# Patient Record
Sex: Male | Born: 1940 | Race: White | Hispanic: No | Smoking: Never smoker
Health system: Southern US, Community
[De-identification: ages and names within clinical notes are randomized; demographics above are authoritative.]

## PROBLEM LIST (undated history)

## (undated) DIAGNOSIS — M179 Osteoarthritis of knee, unspecified: Secondary | ICD-10-CM

## (undated) DIAGNOSIS — M109 Gout, unspecified: Secondary | ICD-10-CM

## (undated) DIAGNOSIS — R791 Abnormal coagulation profile: Secondary | ICD-10-CM

## (undated) DIAGNOSIS — R202 Paresthesia of skin: Secondary | ICD-10-CM

## (undated) DIAGNOSIS — R200 Anesthesia of skin: Secondary | ICD-10-CM

## (undated) DIAGNOSIS — R5383 Other fatigue: Secondary | ICD-10-CM

## (undated) DIAGNOSIS — R2689 Other abnormalities of gait and mobility: Secondary | ICD-10-CM

## (undated) DIAGNOSIS — B009 Herpesviral infection, unspecified: Secondary | ICD-10-CM

## (undated) DIAGNOSIS — Z973 Presence of spectacles and contact lenses: Secondary | ICD-10-CM

## (undated) DIAGNOSIS — E7800 Pure hypercholesterolemia, unspecified: Secondary | ICD-10-CM

## (undated) DIAGNOSIS — F419 Anxiety disorder, unspecified: Secondary | ICD-10-CM

## (undated) DIAGNOSIS — M549 Dorsalgia, unspecified: Secondary | ICD-10-CM

## (undated) DIAGNOSIS — Z8781 Personal history of (healed) traumatic fracture: Secondary | ICD-10-CM

## (undated) DIAGNOSIS — Z9189 Other specified personal risk factors, not elsewhere classified: Secondary | ICD-10-CM

## (undated) DIAGNOSIS — G4700 Insomnia, unspecified: Secondary | ICD-10-CM

## (undated) DIAGNOSIS — E785 Hyperlipidemia, unspecified: Secondary | ICD-10-CM

## (undated) DIAGNOSIS — Z9289 Personal history of other medical treatment: Secondary | ICD-10-CM

## (undated) DIAGNOSIS — I10 Essential (primary) hypertension: Secondary | ICD-10-CM

## (undated) DIAGNOSIS — M1710 Unilateral primary osteoarthritis, unspecified knee: Secondary | ICD-10-CM

## (undated) HISTORY — DX: Abnormal coagulation profile: R79.1

## (undated) HISTORY — PX: TONSILLECTOMY AND ADENOIDECTOMY: SHX28

## (undated) HISTORY — DX: Gout, unspecified: M10.9

## (undated) HISTORY — DX: Osteoarthritis of knee, unspecified: M17.9

## (undated) HISTORY — PX: OTHER SURGICAL HISTORY: SHX169

## (undated) HISTORY — PX: TONSILLECTOMY: SUR1361

## (undated) HISTORY — PX: VASECTOMY: SHX75

## (undated) HISTORY — DX: Anxiety disorder, unspecified: F41.9

## (undated) HISTORY — PX: ROTATOR CUFF REPAIR: SHX139

## (undated) HISTORY — DX: Unilateral primary osteoarthritis, unspecified knee: M17.10

## (undated) HISTORY — DX: Hyperlipidemia, unspecified: E78.5

## (undated) HISTORY — DX: Essential (primary) hypertension: I10

## (undated) HISTORY — DX: Herpesviral infection, unspecified: B00.9

---

## 2000-07-05 ENCOUNTER — Ambulatory Visit (HOSPITAL_COMMUNITY): Admission: RE | Admit: 2000-07-05 | Discharge: 2000-07-05 | Payer: Self-pay | Admitting: Ophthalmology

## 2004-04-29 ENCOUNTER — Ambulatory Visit: Payer: Self-pay | Admitting: Internal Medicine

## 2004-05-04 ENCOUNTER — Ambulatory Visit: Payer: Self-pay | Admitting: Internal Medicine

## 2004-07-14 ENCOUNTER — Ambulatory Visit: Payer: Self-pay | Admitting: Internal Medicine

## 2004-07-28 ENCOUNTER — Ambulatory Visit: Payer: Self-pay | Admitting: Internal Medicine

## 2004-08-10 ENCOUNTER — Ambulatory Visit: Payer: Self-pay | Admitting: Internal Medicine

## 2005-07-13 ENCOUNTER — Ambulatory Visit: Payer: Self-pay | Admitting: Internal Medicine

## 2005-07-21 ENCOUNTER — Ambulatory Visit: Payer: Self-pay | Admitting: Internal Medicine

## 2005-08-27 ENCOUNTER — Ambulatory Visit: Payer: Self-pay | Admitting: Internal Medicine

## 2005-08-27 ENCOUNTER — Ambulatory Visit: Payer: Self-pay | Admitting: Gastroenterology

## 2005-09-15 ENCOUNTER — Ambulatory Visit: Payer: Self-pay | Admitting: Gastroenterology

## 2005-09-15 LAB — HM COLONOSCOPY: HM Colonoscopy: NORMAL

## 2006-08-23 ENCOUNTER — Ambulatory Visit: Payer: Self-pay | Admitting: Internal Medicine

## 2006-08-23 LAB — CONVERTED CEMR LAB
ALT: 27 units/L (ref 0–40)
AST: 36 units/L (ref 0–37)
Basophils Relative: 0.7 % (ref 0.0–1.0)
Bilirubin, Direct: 0.2 mg/dL (ref 0.0–0.3)
CO2: 30 meq/L (ref 19–32)
Calcium: 9.7 mg/dL (ref 8.4–10.5)
Chloride: 102 meq/L (ref 96–112)
Creatinine, Ser: 0.9 mg/dL (ref 0.4–1.5)
Eosinophils Relative: 1.3 % (ref 0.0–5.0)
GFR calc non Af Amer: 91 mL/min
Glucose, Bld: 112 mg/dL — ABNORMAL HIGH (ref 70–99)
HCT: 47.6 % (ref 39.0–52.0)
Ketones, ur: NEGATIVE mg/dL
MCV: 90.2 fL (ref 78.0–100.0)
Neutrophils Relative %: 55.6 % (ref 43.0–77.0)
Nitrite: NEGATIVE
Platelets: 196 10*3/uL (ref 150–400)
RBC: 5.28 M/uL (ref 4.22–5.81)
RDW: 12.5 % (ref 11.5–14.6)
Sodium: 141 meq/L (ref 135–145)
Specific Gravity, Urine: 1.015 (ref 1.000–1.03)
TSH: 1.16 microintl units/mL (ref 0.35–5.50)
Total Bilirubin: 1 mg/dL (ref 0.3–1.2)
Total CHOL/HDL Ratio: 4.5
Total Protein, Urine: NEGATIVE mg/dL
Uric Acid, Serum: 5.4 mg/dL (ref 2.4–7.0)
Urine Glucose: NEGATIVE mg/dL
Urobilinogen, UA: 0.2 (ref 0.0–1.0)
WBC: 8.3 10*3/uL (ref 4.5–10.5)
pH: 6 (ref 5.0–8.0)

## 2006-08-31 ENCOUNTER — Ambulatory Visit: Payer: Self-pay | Admitting: Internal Medicine

## 2006-12-19 ENCOUNTER — Ambulatory Visit: Payer: Self-pay | Admitting: Internal Medicine

## 2006-12-22 ENCOUNTER — Encounter: Admission: RE | Admit: 2006-12-22 | Discharge: 2006-12-22 | Payer: Self-pay | Admitting: Internal Medicine

## 2007-02-17 ENCOUNTER — Ambulatory Visit (HOSPITAL_BASED_OUTPATIENT_CLINIC_OR_DEPARTMENT_OTHER): Admission: RE | Admit: 2007-02-17 | Discharge: 2007-02-17 | Payer: Self-pay | Admitting: Orthopedic Surgery

## 2007-09-25 ENCOUNTER — Telehealth: Payer: Self-pay | Admitting: Internal Medicine

## 2007-11-23 ENCOUNTER — Ambulatory Visit: Payer: Self-pay | Admitting: Internal Medicine

## 2007-11-23 LAB — CONVERTED CEMR LAB
AST: 32 units/L (ref 0–37)
Albumin: 4 g/dL (ref 3.5–5.2)
Alkaline Phosphatase: 138 units/L — ABNORMAL HIGH (ref 39–117)
Basophils Absolute: 0 10*3/uL (ref 0.0–0.1)
Bilirubin Urine: NEGATIVE
Bilirubin, Direct: 0.1 mg/dL (ref 0.0–0.3)
Chloride: 101 meq/L (ref 96–112)
Eosinophils Absolute: 0.2 10*3/uL (ref 0.0–0.7)
GFR calc Af Amer: 125 mL/min
GFR calc non Af Amer: 103 mL/min
HCT: 43.5 % (ref 39.0–52.0)
HDL: 53.6 mg/dL (ref >39.0)
Hemoglobin, Urine: NEGATIVE
LDL Cholesterol: 127 mg/dL — ABNORMAL HIGH (ref 0–99)
MCV: 91.8 fL (ref 78.0–100.0)
Monocytes Absolute: 0.8 10*3/uL (ref 0.1–1.0)
Neutrophils Relative %: 55.2 % (ref 43.0–77.0)
Nitrite: NEGATIVE
Platelets: 174 10*3/uL (ref 150–400)
Potassium: 4 meq/L (ref 3.5–5.1)
RDW: 12.6 % (ref 11.5–14.6)
Sodium: 142 meq/L (ref 135–145)
TSH: 1.89 microintl units/mL (ref 0.35–5.50)
Total Bilirubin: 0.7 mg/dL (ref 0.3–1.2)
Total Protein, Urine: NEGATIVE mg/dL
Urine Glucose: NEGATIVE mg/dL
Urobilinogen, UA: 0.2 (ref 0.0–1.0)
VLDL: 12 mg/dL (ref 0–40)
WBC: 7.6 10*3/uL (ref 4.5–10.5)

## 2007-12-07 ENCOUNTER — Ambulatory Visit: Payer: Self-pay | Admitting: Internal Medicine

## 2007-12-08 DIAGNOSIS — J309 Allergic rhinitis, unspecified: Secondary | ICD-10-CM | POA: Insufficient documentation

## 2007-12-08 DIAGNOSIS — F411 Generalized anxiety disorder: Secondary | ICD-10-CM | POA: Insufficient documentation

## 2007-12-08 DIAGNOSIS — R51 Headache: Secondary | ICD-10-CM

## 2007-12-08 DIAGNOSIS — R519 Headache, unspecified: Secondary | ICD-10-CM | POA: Insufficient documentation

## 2007-12-08 DIAGNOSIS — E785 Hyperlipidemia, unspecified: Secondary | ICD-10-CM

## 2007-12-08 DIAGNOSIS — I10 Essential (primary) hypertension: Secondary | ICD-10-CM

## 2008-02-02 ENCOUNTER — Encounter: Payer: Self-pay | Admitting: Internal Medicine

## 2008-02-05 ENCOUNTER — Encounter: Payer: Self-pay | Admitting: Internal Medicine

## 2008-02-05 ENCOUNTER — Ambulatory Visit: Payer: Self-pay | Admitting: Internal Medicine

## 2008-02-05 DIAGNOSIS — D485 Neoplasm of uncertain behavior of skin: Secondary | ICD-10-CM | POA: Insufficient documentation

## 2008-02-06 ENCOUNTER — Encounter: Payer: Self-pay | Admitting: Internal Medicine

## 2008-04-22 ENCOUNTER — Telehealth: Payer: Self-pay | Admitting: Internal Medicine

## 2008-06-26 ENCOUNTER — Telehealth: Payer: Self-pay | Admitting: Internal Medicine

## 2008-11-15 ENCOUNTER — Encounter: Payer: Self-pay | Admitting: Internal Medicine

## 2008-11-29 ENCOUNTER — Telehealth (INDEPENDENT_AMBULATORY_CARE_PROVIDER_SITE_OTHER): Payer: Self-pay | Admitting: *Deleted

## 2008-12-04 ENCOUNTER — Ambulatory Visit: Payer: Self-pay | Admitting: Internal Medicine

## 2008-12-04 LAB — CONVERTED CEMR LAB
AST: 28 units/L (ref 0–37)
Basophils Absolute: 0.1 10*3/uL (ref 0.0–0.1)
CO2: 34 meq/L — ABNORMAL HIGH (ref 19–32)
Calcium: 9.4 mg/dL (ref 8.4–10.5)
Chloride: 96 meq/L (ref 96–112)
HCT: 40.6 % (ref 39.0–52.0)
HDL: 67.9 mg/dL (ref >39.00)
Leukocytes, UA: NEGATIVE
Lymphs Abs: 1.6 10*3/uL (ref 0.7–4.0)
Monocytes Absolute: 0.7 10*3/uL (ref 0.1–1.0)
Monocytes Relative: 12.1 % — ABNORMAL HIGH (ref 3.0–12.0)
Neutrophils Relative %: 58.6 % (ref 43.0–77.0)
PSA: 0.61 ng/mL (ref 0.10–4.00)
Platelets: 149 10*3/uL — ABNORMAL LOW (ref 150.0–400.0)
Potassium: 3.7 meq/L (ref 3.5–5.1)
RDW: 11.9 % (ref 11.5–14.6)
Sodium: 145 meq/L (ref 135–145)
Specific Gravity, Urine: 1.015 (ref 1.000–1.030)
TSH: 0.95 microintl units/mL (ref 0.35–5.50)
Total Bilirubin: 1.6 mg/dL — ABNORMAL HIGH (ref 0.3–1.2)
Total CHOL/HDL Ratio: 3
Triglycerides: 98 mg/dL (ref 0.0–149.0)
Uric Acid, Serum: 4.9 mg/dL (ref 4.0–7.8)
Urobilinogen, UA: 0.2 (ref 0.0–1.0)
VLDL: 19.6 mg/dL (ref 0.0–40.0)

## 2008-12-10 ENCOUNTER — Ambulatory Visit: Payer: Self-pay | Admitting: Internal Medicine

## 2008-12-11 DIAGNOSIS — M1710 Unilateral primary osteoarthritis, unspecified knee: Secondary | ICD-10-CM

## 2008-12-27 ENCOUNTER — Telehealth: Payer: Self-pay | Admitting: Internal Medicine

## 2009-05-08 ENCOUNTER — Telehealth: Payer: Self-pay | Admitting: Internal Medicine

## 2009-05-22 ENCOUNTER — Encounter: Payer: Self-pay | Admitting: Internal Medicine

## 2009-05-27 ENCOUNTER — Telehealth (INDEPENDENT_AMBULATORY_CARE_PROVIDER_SITE_OTHER): Payer: Self-pay | Admitting: *Deleted

## 2009-05-27 ENCOUNTER — Telehealth: Payer: Self-pay | Admitting: Internal Medicine

## 2009-07-16 ENCOUNTER — Telehealth: Payer: Self-pay | Admitting: Internal Medicine

## 2009-07-16 ENCOUNTER — Ambulatory Visit: Payer: Self-pay | Admitting: Internal Medicine

## 2009-12-08 ENCOUNTER — Telehealth: Payer: Self-pay | Admitting: Internal Medicine

## 2009-12-09 ENCOUNTER — Telehealth: Payer: Self-pay | Admitting: Internal Medicine

## 2009-12-17 ENCOUNTER — Ambulatory Visit: Payer: Self-pay | Admitting: Internal Medicine

## 2009-12-17 DIAGNOSIS — R011 Cardiac murmur, unspecified: Secondary | ICD-10-CM

## 2009-12-18 DIAGNOSIS — M109 Gout, unspecified: Secondary | ICD-10-CM

## 2010-01-05 ENCOUNTER — Ambulatory Visit: Payer: Self-pay

## 2010-01-05 ENCOUNTER — Encounter: Payer: Self-pay | Admitting: Internal Medicine

## 2010-01-05 ENCOUNTER — Ambulatory Visit: Payer: Self-pay | Admitting: Cardiology

## 2010-01-05 ENCOUNTER — Ambulatory Visit (HOSPITAL_COMMUNITY): Admission: RE | Admit: 2010-01-05 | Discharge: 2010-01-05 | Payer: Self-pay | Admitting: Internal Medicine

## 2010-01-11 ENCOUNTER — Encounter: Payer: Self-pay | Admitting: Internal Medicine

## 2010-03-13 ENCOUNTER — Telehealth (INDEPENDENT_AMBULATORY_CARE_PROVIDER_SITE_OTHER): Payer: Self-pay | Admitting: *Deleted

## 2010-07-19 LAB — CONVERTED CEMR LAB
ALT: 29 units/L (ref 0–53)
AST: 35 units/L (ref 0–37)
Albumin: 4.4 g/dL (ref 3.5–5.2)
Basophils Absolute: 0.1 10*3/uL (ref 0.0–0.1)
Calcium: 9.2 mg/dL (ref 8.4–10.5)
Eosinophils Relative: 2.3 % (ref 0.0–5.0)
GFR calc non Af Amer: 82.14 mL/min (ref >60)
HDL: 57.3 mg/dL (ref >39.00)
MCV: 94.5 fL (ref 78.0–100.0)
Monocytes Absolute: 0.6 10*3/uL (ref 0.1–1.0)
Neutrophils Relative %: 57.1 % (ref 43.0–77.0)
Platelets: 177 10*3/uL (ref 150.0–400.0)
Sodium: 146 meq/L — ABNORMAL HIGH (ref 135–145)
TSH: 1.52 microintl units/mL (ref 0.35–5.50)
Total Bilirubin: 0.7 mg/dL (ref 0.3–1.2)
Triglycerides: 134 mg/dL (ref 0.0–149.0)
WBC: 7.7 10*3/uL (ref 4.5–10.5)

## 2010-07-23 NOTE — Letter (Signed)
   [facility] [address] [place], [place]  [address] Phone: [phone]      January 11, 2010   Shaun Carey [address] [place], [place] [address]  RE:  LAB RESULTS  Dear  [name],  The following is an interpretation of your most recent lab tests.  Please take note of any instructions provided or changes to medications that have resulted from your lab work.    2D echo with no significant cause for mile murmur. No further evaluation indicated.     Please come see me if you have any questions about these lab results.   Sincerely Yours,    Jacques Navy MD

## 2010-07-23 NOTE — Assessment & Plan Note (Signed)
Summary: HEP B INJ   MEN  STC   Nurse Visit   Allergies: No Known Drug Allergies  Immunizations Administered:  Hepatitis B Vaccine # 2:    Vaccine Type: HepB Adult    Site: left deltoid    Mfr: Merck    Dose: 1.0 ml    Route: IM    Given by: Lucious Groves    Exp. Date: 03/02/2011    Lot #: [id number]    VIS given: 01/05/06 version given July 16, 2009.  Orders Added: 1)  Hepatitis B Vaccine >44yrs [90746] 2)  Admin 1st Vaccine [phone]

## 2010-07-23 NOTE — Progress Notes (Signed)
  Phone Note Refill Request Message from:  Fax from Pharmacy on December 09, 2009 1:09 PM  Refills Requested: Medication #1:  FUROSEMIDE 40 MG  TABS Take 1 tablet by mouth once a day   Last Refilled: 08/31/2009    Prescriptions: FUROSEMIDE 40 MG  TABS (FUROSEMIDE) Take 1 tablet by mouth once a day  #90 x 3   Entered by:   Rock Nephew CMA   Authorized by:   Jacques Navy MD   Signed by:   Rock Nephew CMA on 12/09/2009   Method used:   Electronically to        [facility] [address] (retail)       [address]       [place], [place]  [address]       Ph: [phone] or [phone]       Fax: [phone]   RxID:   [id number]

## 2010-07-23 NOTE — Progress Notes (Signed)
Summary: REFILLs  Phone Note Refill Request Message from:  Fax from Pharmacy on December 08, 2009 4:47 PM  Refills Requested: Medication #1:  DILTIAZEM HCL ER BEADS 360 MG  CP24 1 once daily  Medication #3:  ALPRAZOLAM 0.5 MG  TABS Take 1 tablet by mouth every 8 hours as needed  PLease advise if ok to refill alprazolam  Initial call taken by: Rock Nephew CMA,  December 08, 2009 4:47 PM  Follow-up for Phone Call        ok to refill alprazolam x 5 Follow-up by: Jacques Navy MD,  December 08, 2009 6:55 PM    Prescriptions: ALPRAZOLAM 0.5 MG  TABS (ALPRAZOLAM) Take 1 tablet by mouth every 8 hours as needed  #90 x 5   Entered by:   Lamar Sprinkles, CMA   Authorized by:   Jacques Navy MD   Signed by:   Lamar Sprinkles, CMA on 12/09/2009   Method used:   Telephoned to ...       [facility] [address] [facility] (retail)       [address]       [place], [place]  [address]       Ph: [phone] or [phone]       Fax: [phone]   RxID:   [id number] ALLOPURINOL 300 MG  TABS (ALLOPURINOL) Take 1 tablet by mouth once a day  #90 x 3   Entered by:   Rock Nephew CMA   Authorized by:   Jacques Navy MD   Signed by:   Rock Nephew CMA on 12/08/2009   Method used:   Electronically to        [facility] [address] (retail)       [address]       [place], [place]  [address]       Ph: [phone] or [phone]       Fax: [phone]   RxID:   [id number] PAROXETINE HCL 37.5 MG  TB24 (PAROXETINE HCL) Take 1 tablet by mouth once a day  #90 x 3   Entered by:   Rock Nephew CMA   Authorized by:   Jacques Navy MD   Signed by:   Rock Nephew CMA on 12/08/2009   Method used:   Electronically to        [facility] [address] [address] (retail)       [address]       [place], [place]  [address]       Ph: [phone] or [phone]       Fax: [phone]  RxID:   [id number] DILTIAZEM HCL ER BEADS 360 MG  CP24 (DILTIAZEM HCL ER BEADS) 1 once daily  #90 x 3   Entered by:   Rock Nephew CMA   Authorized by:   Jacques Navy MD   Signed by:   Rock Nephew CMA on 12/08/2009   Method used:   Electronically to        [facility] [address] (retail)       [address]       [place], [place]  [address]       Ph: [phone] or [phone]       Fax: [phone]   RxID:  [id number]  

## 2010-07-23 NOTE — Progress Notes (Signed)
  Phone Note Other Incoming   Request: Send information Summary of Call: Request for records received from [facility]. Request forwarded to [employer].      Appended Document:  Request from [facility] sent to [employer]

## 2010-07-23 NOTE — Assessment & Plan Note (Signed)
Summary: CPX/CIGNA/#/CD   Vital Signs:  Patient profile:   70 year old male Height:      72 inches Weight:      208 pounds BMI:     28.31 O2 Sat:      95 % on Room air Temp:     98.1 degrees F oral Pulse rate:   70 / minute BP sitting:   132 / 70  (left arm) Cuff size:   regular  Vitals Entered By: Bill Salinas CMA (December 17, 2009 10:06 AM)  O2 Flow:  Room air CC: pt here for cpx/ he will also get series #3 of hep b today/ ab  Vision Screening:      Vision Comments: Had Eye exam with slight change in RX may 2011 with Dr Melburn Hake   Primary Care Provider:  Jacques Navy MD  CC:  pt here for cpx/ he will also get series #3 of hep b today/ ab.  History of Present Illness: Patient presents for routine medical exam and follow-up. He has been feeling well with no new medical complaints, no injuries, no surgeries.   Current Medications (verified): 1)  Valtrex 500 Mg Tabs (Valacyclovir Hcl) .... Take 1 Tablet By Mouth Two Times A Day 2)  Benazepril Hcl 40 Mg  Tabs (Benazepril Hcl) .... Take 1 Tablet By Mouth Once A Day 3)  Allegra 180 Mg  Tabs (Fexofenadine Hcl) .... Take 1 Tablet By Mouth Once A Day As Needed 4)  Nabumetone 750 Mg  Tabs (Nabumetone) .... Take 2 Tab By Mouth At Bedtime 5)  Furosemide 40 Mg  Tabs (Furosemide) .... Take 1 Tablet By Mouth Once A Day 6)  Ranitidine Hcl 150 Mg  Caps (Ranitidine Hcl) .... Take 1 Tablet By Mouth Two Times A Day 7)  Alprazolam 0.5 Mg  Tabs (Alprazolam) .... Take 1 Tablet By Mouth Every 8 Hours As Needed 8)  Fioricet 50-325-40 Mg  Tabs (Butalbital-Apap-Caffeine) .... Take 1 Tablet By Mouth Every 8 Hours As Needed 9)  Diltiazem Hcl Er Beads 360 Mg  Cp24 (Diltiazem Hcl Er Beads) .Marland Kitchen.. 1 Once Daily 10)  Cialis 20 Mg  Tabs (Tadalafil) .... As Directed As Needed 11)  Allopurinol 300 Mg  Tabs (Allopurinol) .... Take 1 Tablet By Mouth Once A Day 12)  Aspirin 81 Mg  Tabs (Aspirin) .... Take 1 Tablet By Mouth Once A Day 13)  Flonase 50  Mcg/act  Susp (Fluticasone Propionate) .... As Directed As Needed 14)  Paroxetine Hcl 37.5 Mg  Tb24 (Paroxetine Hcl) .... Take 1 Tablet By Mouth Once A Day 15)  Co Q-10 200 Mg  Caps (Coenzyme Q10) .... Take 1 Tablet By Mouth Once A Day 16)  Vitamin E 400 Unit  Caps (Vitamin E) .... Take 1 Tablet By Mouth Once A Day 17)  B-100   Tabs (Vitamins-Lipotropics) .... Take 1 Tablet By Mouth Once A Day 18)  Multivitamins   Tabs (Multiple Vitamin) .... Take 1 Tablet By Mouth Once A Day 19)  Osteo Bioflex .Marland Kitchen.. 4 Tabs Daily. 20)  Red Yeast Rice .Marland Kitchen.. 2 Tabs Daily.  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 Farris Has)  Past Surgical History: Last updated: 2008-01-06 Tonsillectomy Vasectomy  '84 ORIF shoulder fracture '92 fracture - arm   Family History: Last updated: 06-Jan-2008 father-deceased @49  : Hermenia Fiscal  disease mother- 7: alzheimer's, glucoma Neg- colon or prostate cancer; DM, CAD  Social History: Last updated: 12/07/2007 [employer] married '65 - 13 divorced; married '80- 8 years widowed; married '95- 21 months divorced 1 daughter - '70, 1 son - '72 work: Engineer, drilling  Risk Factors: Alcohol Use: <1 (12/10/2008) Caffeine Use: less than one per day (12/10/2008) Diet: helathy diet (12/10/2008) Exercise: yes (12/10/2008)  Risk Factors: Smoking Status: quit (12/10/2008)  Review of Systems  The patient denies anorexia, fever, weight loss, weight gain, vision loss, hoarseness, chest pain, syncope, dyspnea on exertion, peripheral edema, prolonged cough, hemoptysis, abdominal pain, melena, hematochezia, severe indigestion/heartburn, incontinence, muscle weakness, suspicious skin lesions, difficulty walking, depression, unusual  weight change, enlarged lymph nodes, and angioedema.    Physical Exam  General:  Well-developed,well-nourished,in no acute distress; alert,appropriate and cooperative throughout examination Head:  Normocephalic and atraumatic without obvious abnormalities. No apparent alopecia or balding. Eyes:  No corneal or conjunctival inflammation noted. EOMI. Perrla. Funduscopic exam benign, without hemorrhages, exudates or papilledema. Vision grossly normal. Ears:  External ear exam shows no significant lesions or deformities.  Otoscopic examination reveals clear canals, tympanic membranes are intact bilaterally without bulging, retraction, inflammation or discharge. Hearing is grossly normal bilaterally. Nose:  no external deformity and no external erythema.   Mouth:  Oral mucosa and oropharynx without lesions or exudates.  Teeth in good repair. Neck:  supple, full ROM, no thyromegaly, and no carotid bruits.   Chest Wall:  no deformities.   Lungs:  Normal respiratory effort, chest expands symmetrically. Lungs are clear to auscultation, no crackles or wheezes. Heart:  normal rate, regular rhythm, no gallop, no rub, no JVD, and no HJR.  Systolic murmur best RSB. Abdomen:  soft, non-tender, normal bowel sounds, no distention, no masses, and no hepatomegaly.   Rectal:  No external abnormalities noted. Normal sphincter tone. No rectal masses or tenderness. Prostate:  Prostate gland firm and smooth, no enlargement, nodularity, tenderness, mass, asymmetry or induration. Msk:  normal ROM, no joint tenderness, no joint swelling, no joint warmth, no redness over joints, and no joint instability.   Pulses:  2+ radial and DP Extremities:  trace left pedal edema and 1+ left pedal edema.   Neurologic:  alert & oriented X3, cranial nerves II-XII intact, strength normal in all extremities, gait normal, and DTRs symmetrical and normal.   Skin:  turgor normal, color normal, no suspicious lesions, and no ulcerations.     Cervical Nodes:  no anterior cervical adenopathy and no posterior cervical adenopathy.   Axillary Nodes:  no R axillary adenopathy and no L axillary adenopathy.   Inguinal Nodes:  no R inguinal adenopathy and no L inguinal adenopathy.   Psych:  Oriented X3, memory intact for recent and remote, normally interactive, and good eye contact.     Impression & Recommendations:  Problem # 1:  HEART MURMUR, SYSTOLIC (ICD-785.2) Newly noted murmur - most likely mild AV calcification.  Plan - 2 D echo Orders: Cardiology Referral (Cardiology)  Problem # 2:  OSTEOARTHRITIS, KNEES, BILATERAL (ICD-715.96) Stable - doing better since retirement and not having to stand for 12 hour shifts. No significant limitations on his activities.  Plan - continue present medications.  His updated medication list for this problem includes:    Nabumetone 750 Mg Tabs (Nabumetone) .Marland Kitchen... Take 2 tab by mouth at bedtime    Fioricet 50-325-40 Mg Tabs (Butalbital-apap-caffeine) .Marland Kitchen... Take 1 tablet by mouth every 8 hours as needed  Aspirin 81 Mg Tabs (Aspirin) .Marland Kitchen... Take 1 tablet by mouth once a day  Orders: TLB-CBC Platelet - w/Differential (85025-CBCD)  Problem # 3:  HYPERTENSION (ICD-401.9)  His updated medication list for this problem includes:    Benazepril Hcl 40 Mg Tabs (Benazepril hcl) .Marland Kitchen... Take 1 tablet by mouth once a day    Furosemide 40 Mg Tabs (Furosemide) .Marland Kitchen... Take 1 tablet by mouth once a day    Diltiazem Hcl Er Beads 360 Mg Cp24 (Diltiazem hcl er beads) .Marland Kitchen... 1 once daily  Orders: TLB-BMP (Basic Metabolic Panel-BMET) (80048-METABOL)  BP today: 132/70 Prior BP: 124/64 (12/10/2008)   good control on present medications. Labs OK  Problem # 4:  HYPERLIPIDEMIA (ICD-272.4) Due for lab with recommendations to follow.   Orders: TLB-Lipid Panel (80061-LIPID) TLB-Hepatic/Liver Function Pnl (80076-HEPATIC) TLB-TSH (Thyroid Stimulating Hormone) (84443-TSH)  Addendum - LDL 93 - good control.  Will continue present medications.   Problem # 5:  ANXIETY (ICD-300.00) Stable and well controlled on present medications. May be able to consider withdrawal of medication as we see how he does in retirement in regard to stress and anxiety.  His updated medication list for this problem includes:    Alprazolam 0.5 Mg Tabs (Alprazolam) .Marland Kitchen... Take 1 tablet by mouth every 8 hours as needed    Paroxetine Hcl 37.5 Mg Tb24 (Paroxetine hcl) .Marland Kitchen... Take 1 tablet by mouth once a day  Problem # 6:  ALLERGIC RHINITIS (ICD-477.9) Symptoms are controlled. He is now using OTC loratadine in lieu of fexofenadine.   His updated medication list for this problem includes:    Loratadine 10 Mg Tabs (Loratadine) .Marland Kitchen... 1 by mouth once daily    Flonase 50 Mcg/act Susp (Fluticasone propionate) .Marland Kitchen... As directed as needed  Problem # 7:  GOUT, UNSPECIFIED (ICD-274.9) Patient without an gout flares. He continues on allopurinol.  His updated medication list for this problem includes:    Allopurinol 300 Mg Tabs (Allopurinol) .Marland Kitchen... Take 1 tablet by mouth once a day  Problem # 8:  Preventive Health Care (ICD-V70.0) Unremarkable interval history. Normal physical exam. Lab results are within normal limits.  Last colonosocpy '07. Immunizations for pneumovax and shingles up to date.  Patient is in good spirits with no evidence of depression. He has no increased risk for falls and in fact has not had any falls or injuries.  In summary - a nice man who is medically stable at this time. He will return as needed or 1 year.   Complete Medication List: 1)  Valtrex 500 Mg Tabs (Valacyclovir hcl) .... Take 1 tablet by mouth two times a day 2)  Benazepril Hcl 40 Mg Tabs (Benazepril hcl) .... Take 1 tablet by mouth once a day 3)  Loratadine 10 Mg Tabs (Loratadine) .Marland Kitchen.. 1 by mouth once daily 4)  Nabumetone 750 Mg Tabs (Nabumetone) .... Take 2 tab by mouth at bedtime 5)  Furosemide 40 Mg Tabs (Furosemide) .... Take 1 tablet by  mouth once a day 6)  Ranitidine Hcl 150 Mg Caps (Ranitidine hcl) .... Take 1 tablet by mouth two times a day 7)  Alprazolam 0.5 Mg Tabs (Alprazolam) .... Take 1 tablet by mouth every 8 hours as needed 8)  Fioricet 50-325-40 Mg Tabs (Butalbital-apap-caffeine) .... Take 1 tablet by mouth every 8 hours as needed 9)  Diltiazem Hcl Er Beads 360 Mg Cp24 (Diltiazem hcl er beads) .Marland Kitchen.. 1 once daily 10)  Cialis 20 Mg Tabs (Tadalafil) .... As directed as needed 11)  Allopurinol 300  Mg Tabs (Allopurinol) .... Take 1 tablet by mouth once a day 12)  Aspirin 81 Mg Tabs (Aspirin) .... Take 1 tablet by mouth once a day 13)  Flonase 50 Mcg/act Susp (Fluticasone propionate) .... As directed as needed 14)  Paroxetine Hcl 37.5 Mg Tb24 (Paroxetine hcl) .... Take 1 tablet by mouth once a day 15)  Co Q-10 200 Mg Caps (Coenzyme q10) .... Take 1 tablet by mouth once a day 16)  Vitamin E 400 Unit Caps (Vitamin e) .... Take 1 tablet by mouth once a day 17)  B-100 Tabs (Vitamins-lipotropics) .... Take 1 tablet by mouth once a day 18)  Multivitamins Tabs (Multiple vitamin) .... Take 1 tablet by mouth once a day 19)  Osteo Bioflex  .Marland Kitchen.. 4 tabs daily. 20)  Red Yeast Rice  .Marland Kitchen.. 2 tabs daily.  Other Orders: Pneumococcal Vaccine (69629) Admin 1st Vaccine (52841) State-Hepatitis B Vaccine Adult IM (32440N) Admin of Any Addtl Vaccine (02725) Subsequent annual wellness visit with prevention plan (D6644)  Patient: Shaun Carey Note: All result statuses are Final unless otherwise noted.  Tests: (1) BMP (METABOL)   Sodium               [H]  146 mEq/L                   135-145   Potassium                 3.6 mEq/L                   3.5-5.1   Chloride                  112 mEq/L                   96-112   Carbon Dioxide            26 mEq/L                    19-32   Glucose                   98 mg/dL                    03-47   BUN                       15 mg/dL                    4-25   Creatinine                1.0 mg/dL                    9.5-6.3   Calcium                   9.2 mg/dL                   8.7-56.4   GFR                       82.14 mL/min                >60  Tests: (2) Lipid Panel (LIPID)   Cholesterol               177 mg/dL  0-200     ATP III Classification            Desirable:  < 200 mg/dL                    Borderline High:  200 - 239 mg/dL               High:  > = 240 mg/dL   Triglycerides             134.0 mg/dL                 1.6-109.6     Normal:  <150 mg/dL     Borderline High:  045 - 199 mg/dL   HDL                       40.98 mg/dL                 >11.91   VLDL Cholesterol          26.8 mg/dL                  4.7-82.9   LDL Cholesterol           93 mg/dL                    5-62  CHO/HDL Ratio:  CHD Risk                             3                    Men          Women     1/2 Average Risk     3.4          3.3     Average Risk          5.0          4.4     2X Average Risk          9.6          7.1     3X Average Risk          15.0          11.0                           Tests: (3) Hepatic/Liver Function Panel (HEPATIC)   Total Bilirubin           0.7 mg/dL                   1.3-0.8   Direct Bilirubin          0.1 mg/dL                   6.5-7.8   Alkaline Phosphatase      107 U/L                     39-117   AST                       35 U/L                      0-37   ALT  29 U/L                      0-53   Total Protein             7.6 g/dL                    1.0-2.7   Albumin                   4.4 g/dL                    2.5-3.6  Tests: (4) CBC Platelet w/Diff (CBCD)   White Cell Count          7.7 K/uL                    4.5-10.5   Red Cell Count       [L]  4.07 Mil/uL                 4.22-5.81   Hemoglobin                13.4 g/dL                   64.4-03.4   Hematocrit           [L]  38.5 %                      39.0-52.0   MCV                       94.5 fl                     78.0-100.0   MCHC                      34.9 g/dL                    74.2-59.5   RDW                       12.9 %                      11.5-14.6   Platelet Count            177.0 K/uL                  150.0-400.0   Neutrophil %              57.1 %                      43.0-77.0   Lymphocyte %              30.9 %                      12.0-46.0   Monocyte %                8.4 %                       3.0-12.0   Eosinophils%              2.3 %  0.0-5.0   Basophils %               1.3 %                       0.0-3.0   Neutrophill Absolute      4.4 K/uL                    1.4-7.7   Lymphocyte Absolute       2.4 K/uL                    0.7-4.0   Monocyte Absolute         0.6 K/uL                    0.1-1.0  Eosinophils, Absolute                             0.2 K/uL                    0.0-0.7   Basophils Absolute        0.1 K/uL                    0.0-0.1  Tests: (5) TSH (TSH)   FastTSH                   1.52 uIU/mL                 0.35-5.50Prescriptions: ALPRAZOLAM 0.5 MG  TABS (ALPRAZOLAM) Take 1 tablet by mouth every 8 hours as needed  #90 x 5   Entered and Authorized by:   Jacques Navy MD   Signed by:   Jacques Navy MD on 12/17/2009   Method used:   Handwritten   RxID:   [id number] PAROXETINE HCL 37.5 MG  TB24 (PAROXETINE HCL) Take 1 tablet by mouth once a day  #90 x 3   Entered and Authorized by:   Jacques Navy MD   Signed by:   Jacques Navy MD on 12/17/2009   Method used:   Electronically to        [facility] [address] (retail)       [address]       [place], [place]  [address]       Ph: [phone] or [phone]       Fax: [phone]   RxID:   [id number] ALLOPURINOL 300 MG  TABS (ALLOPURINOL) Take 1 tablet by mouth once a day  #90 x 3   Entered and Authorized by:   Jacques Navy MD   Signed by:   Jacques Navy MD on 12/17/2009   Method used:   Electronically to        [facility] [address] (retail)       [address]       [place], [place]  [address]       Ph: [phone] or [phone]       Fax: [phone]   RxID:   [id number] CIALIS 20 MG  TABS (TADALAFIL) as directed as needed  #6 x 12   Entered and Authorized by:   Jacques Navy MD   Signed by:   Jacques Navy MD  on 12/17/2009   Method used:   Electronically to        [facility] [address] (retail)       [address]       [place], [place]  [address]       Ph: [phone] or [phone]       Fax: [phone]   RxID:   [id number] DILTIAZEM HCL ER BEADS 360 MG  CP24 (DILTIAZEM HCL ER BEADS) 1 once daily  #90 x 3   Entered and Authorized by:   Jacques Navy MD   Signed by:   Jacques Navy MD on 12/17/2009   Method used:   Electronically to        [facility] [address] (retail)       [address]       [place], [place]  [address]       Ph: [phone] or [phone]       Fax: [phone]   RxID:   [id number] FIORICET 50-325-40 MG  TABS [facility]) Take 1 tablet by mouth every 8 hours as needed  #90 x 12   Entered and Authorized by:   Jacques Navy MD   Signed by:   Jacques Navy MD on 12/17/2009   Method used:   Electronically to        [facility] [address] (retail)       [address]       [place], [place]  [address]       Ph: [phone] or [phone]       Fax: [phone]   RxID:   [id number] FUROSEMIDE 40 MG  TABS (FUROSEMIDE) Take 1 tablet by mouth once a day  #90 x 3   Entered and Authorized by:   Jacques Navy MD   Signed by:   Jacques Navy MD on 12/17/2009   Method used:   Electronically to        [facility] [address] (retail)       [address]       [place], [place]  [address]       Ph: [phone] or [phone]       Fax: [phone]   RxID:    [id number] NABUMETONE 750 MG  TABS (NABUMETONE) Take 2 tab by mouth at bedtime  #180 x 3   Entered and Authorized by:   Jacques Navy MD   Signed by:   Jacques Navy MD on 12/17/2009   Method used:   Electronically to        [facility] [address] (retail)       [address]       [place], [place]  [address]       Ph: [phone] or [phone]       Fax: [phone]   RxID:   [id number] BENAZEPRIL HCL 40 MG  TABS (BENAZEPRIL HCL) Take 1 tablet by mouth once a day  #90 x 3   Entered and Authorized by:   Jacques Navy MD   Signed by:   Jacques Navy MD on 12/17/2009   Method used:   Electronically to  [facility] [facility] (retail)       [address]       [place], [place]  [address]       Ph: [phone] or [phone]       Fax: [phone]   RxID:   [id number] VALTREX 500 MG TABS (VALACYCLOVIR HCL) Take 1 tablet by mouth two times a day  #60 x PRN   Entered and Authorized by:   Jacques Navy MD   Signed by:   Jacques Navy MD on 12/17/2009   Method used:   Electronically to        [facility] [address] [address] (retail)       [address]       [place], [place]  [address]       Ph: [phone] or [phone]       Fax: [phone]   RxID:   [id number]    Immunization History:  Influenza Immunization History:    Influenza:  historical (03/25/2009)  Immunizations Administered:  Pneumonia Vaccine:    Vaccine Type: Pneumovax    Site: left deltoid    Dose: 0.5 ml    Route: IM    Given by: Ami Bullins CMA    Exp. Date: 06/20/2011    Lot #: [id number]    VIS given: 01/17/96 version given December 17, 2009.  Hepatitis B Vaccine # 1:    Vaccine Type: State HepB Adult    Site: left deltoid    Mfr: Merck    Dose: 1.0 ml    Route: IM    Given by: Ami Bullins CMA    Exp. Date: 03/02/2011    Lot #: [id number]    VIS  given: 01/05/06 version given December 18, 2009.  Hepatitis B Vaccine # 3:    Vaccine Type: State HepB Adult    Site: left deltoid    Mfr: Merck    Dose: 1.0 ml    Route: IM    Given by: Ami Bullins CMA    Exp. Date: 03/02/2011    Lot #: [id number]    VIS given: 01/05/06 version given December 18, 2009.      Appended Document: flow sheet correction     Clinical Lists Changes  Observations: Removed observation of PNEUMOVAXVIS: 01/17/96 version given December 17, 2009. (12/17/2009 9:52) Removed observation of PNEUMOVAXSIT: left deltoid (12/17/2009 9:52) Removed observation of PNEUMOVAXRTE: IM (12/17/2009 9:52) Removed observation of PNEUMOVAXLOT: 0386AA (12/17/2009 9:52) Removed observation of PNEUMOVAXEXP: 06/20/2011 (12/17/2009 9:52) Removed observation of PNEUMOVAXDOS: 0.5 ml (12/17/2009 9:52) Removed observation of PNEUMOVAXBY: Ami Bullins CMA (12/17/2009 9:52) Removed observation of PNEUMOVAX: Pneumovax (12/17/2009 9:52)      Appended Document: CPX/CIGNA/#/CD per the problem list # 1-7 the patient is medically stable and independent in all activities of daily living.

## 2010-07-23 NOTE — Progress Notes (Signed)
Summary: Hep B inj question  Phone Note Outgoing Call Call back at [phone]   Summary of Call: Patient came for Hep B inj today and it is supposed to be #2 of the 3 inj series, but the patient wants to know does he need to start over with the one rec'd today being #1--- #1 was given in 01/2009 and patient did not receive #2 30 days later as planned. Please advise. Initial call taken by: Lucious Groves,  July 16, 2009 9:48 AM  Follow-up for Phone Call        no need to restart, may pick up from here. Checked "UpToDate".com Follow-up by: Jacques Navy MD,  July 16, 2009 1:26 PM  Additional Follow-up for Phone Call Additional follow up Details #1::        incorrect.Lucious Groves,  July 16, 2009 3:07 PM  Both home 3 and cell # patient gave me today appear to be incorrect. Looked the previous notes and found  # [id number]. Patient notified.  Additional Follow-up by: Lucious Groves,  July 16, 2009 4:02 PM    Additional Follow-up for Phone Call Additional follow up Details #2::    Patient would like to know is is ok for him to give flu shots now that he has had inj #2 of the sieries or should he wait until after the 3rd inj? He wants to ensure that if he accidently gets a needle stick he cannot contract Hep B. Please advise.Lucious Groves,  July 16, 2009 4:06 PM  Additional Follow-up for Phone Call Additional follow up Details #3:: Details for Additional Follow-up Action Taken: he would ot be fully protected.................Jacques Navy MD,  July 16, 2009 5:23 PM  # busy.............................Marland KitchenLamar Sprinkles, CMA  July 18, 2009 9:55 AM   # given was busy, left vm on hm #.................................Marland KitchenLamar Sprinkles, CMA  July 18, 2009 6:09 PM  left mess to call office back...............................Marland KitchenLamar Sprinkles, CMA  July 21, 2009 11:12 AM   Pt informed, he will get 3rd injection at next office visit in June Additional Follow-up by: Lamar Sprinkles, CMA,   July 21, 2009 2:26 PM

## 2010-08-11 ENCOUNTER — Encounter: Payer: Self-pay | Admitting: Internal Medicine

## 2010-08-11 ENCOUNTER — Telehealth: Payer: Self-pay | Admitting: Internal Medicine

## 2010-08-18 NOTE — Progress Notes (Signed)
  Phone Note Refill Request Message from:  Fax from Pharmacy on August 11, 2010 11:43 AM  Refills Requested: Medication #1:  ALPRAZOLAM 0.5 MG  TABS Take 1 tablet by mouth every 8 hours as needed fax from [facility] [address], please Advise refills  Initial call taken by: Ami Bullins CMA,  August 11, 2010 11:44 AM  Follow-up for Phone Call        k    Prescriptions: ALPRAZOLAM 0.5 MG  TABS (ALPRAZOLAM) Take 1 tablet by mouth every 8 hours as needed  #90 x 5   Entered and Authorized by:   Jacques Navy MD   Signed by:   Jacques Navy MD on 08/11/2010   Method used:   Telephoned to ...       [facility] [address] (retail)       [address]       [place], [place]  [address]       Ph: [phone] or [phone]       Fax: [phone]   RxID:   [id number]

## 2010-09-08 NOTE — Medication Information (Signed)
Summary: Parox[facility]  [facility]   Imported By: Sherian Rein 08/27/2010 12:33:59  _____________________________________________________________________  External Attachment:    Type:   Image     Comment:   External Document

## 2010-11-03 NOTE — Op Note (Signed)
NAMEAFTON, MIKELSON             ACCOUNT NO.:  [id number]   MEDICAL RECORD NO.:  [id number]          PATIENT TYPE:  AMB   LOCATION:  DSC                          FACILITY:  [facility]   PHYSICIAN:  Katy Fitch. Sypher, M.D. DATE OF BIRTH:  1941-02-25   DATE OF PROCEDURE:  02/17/2007  DATE OF DISCHARGE:                               OPERATIVE REPORT   PREOPERATIVE DIAGNOSIS:  Methicillin-resistant Staphylococcus aureus  abscess, left small finger dorsal aspect.   POSTOPERATIVE DIAGNOSIS:  Methicillin-resistant Staphylococcus aureus  abscess, left small finger dorsal aspect.   OPERATION:  Incision and drainage of left small finger dorsal abscess.   OPERATING SURGEON:  Katy Fitch. Sypher, M.D.  No assistant.   ANESTHESIA:  2% lidocaine block of common digital nerves in palm to left  small finger and dorsal ulnar sensory branch to left small finger  without supplemental sedation.  Anesthetist is Dr. Teressa Senter.  No  anesthesiologist was involved in this case.   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 Louanna Raw of [facility]  for evaluation and management of a severe left small finger abscess.   On February 12, 2007, Mr. Lipford was speared by a stingray while  attending his fishing nets.  He cleaned his wound and did well for 24  hours.  On February 13, 2007, he noted swelling and rubor.  By February 16, 2007, he had a violaceous digit with an apparent abscess forming on the  dorsal aspect of the left small finger.   He was seen by Dr. Earlene Plater [facility], where he was provided 2 g of  Rocephin IM followed by oral rifampin and oral doxycycline.  He was  referred for hand surgery consult.   An February 17, 2007, he was noted to have a necrotic abscess over the  proximal phalangeal segment of the finger.  His rubor and violaceous  changes were extending on the dorsal aspect of the hand distally to the  nail bed and  proximally to the MP joint.  He did not have lymphangitis  over the hand or arm at this time.   Mr. Minichiello was advised to proceed to incision and drainage of his  abscess.   He is transferred to the [facility] day surgery center for immediate surgery.   Given the aggressive nature of his infection, I recommended providing 1  g of vancomycin IV in addition to his oral antibiotics.  This was  started in the day surgery center holding area prior to the procedure.   PROCEDURE:  Ziquan Fidel was brought to the operating room and placed  in supine position upon the operating table.   Following placement of an IV in the dorsum of the right hand, 1 g of  vancomycin was provided and will be administered over 90 minutes slowly.  He tolerated this well.  He was transferred to room #3, placed in supine  position upon the operating table.   After Betadine prep of his wrist and hand, a 2% lidocaine block was  placed at the level the common digital nerves  in the palm proximal to  the area of infection and over the dorsal ulnar sensory branch at the  wrist.  After 10 minutes, excellent anesthesia was achieved.   The left arm was then prepped with Betadine soap and solution and  sterilely draped.  A pneumatic tourniquet was applied to the proximal  forearm but was not inflated.   When anesthesia was satisfactory, the abscess was incised with a 2.5-cm  dorsal incision.  This had a markedly necrotic center with fat necrosis  and saponification of fat.  The extensor tendon was exposed.  The skin  margins were undermined with blunt scissors and the abscess was broken  up thoroughly with the scissors dissection, revealing purulent material.   A rongeur was used to remove all necrotic subcutaneous fat.  The wound  was then inspected for hemostasis and packed with Iodoform gauze.  The  wound was dressed open with sterile gauze and Coban.   Mr. Mehlhoff is advised to contact Dr. Merlyn Lot this weekend should  he have  any difficulties of fever, ascending lymphangitis, rubor, pain or  complications of his medication.   We will see him back for whirlpool baths and Iodoform packing removal on  February 21, 2007.  He will be seen sooner p.r.n. complications of his  infection or treatment.      Katy Fitch Sypher, M.D.  Electronically Signed     RVS/MEDQ  D:  02/17/2007  T:  02/18/2007  Job:  [id number]

## 2010-11-06 NOTE — Letter (Signed)
September 01, 2006    Wilmon Arms. Corliss Skains, M.D.  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  The patient has been asymptomatic but  he is interested in having this repaired before it gets larger.  It has  been increasing over the last couple of years.   Please find enclosed my H&P from today's visit.   If I can provide any additional information, please do not hesitate to  contact me.   I appreciate your assistance in the care of [name] and look  forward to hearing from you.   Enclosure:  H&P dictation from 08/31/2006    Sincerely,      Rosalyn Gess. Norins, MD  Electronically Signed    MEN/MedQ  DD: 09/01/2006  DT: 09/02/2006  Job #: [id number]

## 2010-11-06 NOTE — Op Note (Signed)
[place]  Patient:    RAAHIM, SHARTZER                MRN: [id number] Proc. Date: 07/05/00 Adm. Date:  [id number] Attending:  Tommy Medal                           Operative Report  INDICATIONS/ JUSTIFICATIONS FOR PROCEDURE:  Mr. Kohen has been followed in my office since 1995 and reports that for the last several years, he has had increasing redundant skin of his upper eyelids.  This bothers him and the eyelids get heavy after working on the computer and this interferes with his upper field of vision.  He can feel the weight of the lid.  Otherwise, the pressure is 15 and his visual acuity is 20/20.  Photographs were taken to document this and visual fields were performed to show how the skin caused a reduction in the field of the upper lid.  Examination does show that the skin droops over the lashes and comes to the lid margin.  The cornea, conjunctiva, lens, anterior chamber and fundus examinations are normal.  After discussing this with him, he decided to have upper eyelid optical blepharoplasties.  Medically, he is followed by Dr. Debby Bud and showed be stable with respect to having the procedures done.  JUSTIFICATION FOR PERFORMING PROCEDURE IN OUTPATIENT SETTING:  Routine.  JUSTIFICATION FOR OVERNIGHT STAY:  None.  PREOPERATIVE DIAGNOSIS:  Blepharochalasis with visual impairment.  POSTOPERATIVE DIAGNOSIS:  Blepharochalasis with visual impairment.  SURGEON:  Robert L. Dione Booze, M.D.  PROCEDURE:  Upper eyelid optical blepharoplasties.  ANESTHESIA:  Xylocaine 1% with epinephrine.  DESCRIPTION OF PROCEDURE:  The patient arrived in the minor surgery room at [facility] and was prepped and draped in the routine fashion.  A frontal nerve block was given on each side and the skin to be removed was demarcated using a marking pencil and then excised.  Underlying fatty tissue was also excised.  Each wound was then closed  using a running 6-0 nylon suture and the eyes were bandaged after Neosporin ointment was used.  The patient then left the minor surgery room having done nicely.  FOLLOW-UP CARE:  Patient is to remove the patches in about 3 or 4 hours and to be seen in 5 or 6 days to have the sutures removed. DD:  07/05/00 TD:  07/05/00 Job: [id number]

## 2010-11-06 NOTE — Assessment & Plan Note (Signed)
[facility]                           PRIMARY CARE OFFICE NOTE   NAME:Shaun Carey, Shaun Carey                    MRN:          [id number]  DATE:08/31/2006                            DOB:          1940/11/01    Shaun Carey presents today for routine follow-up evaluation and exam.   CHIEF COMPLAINT:  The patient has a progressively enlarging umbilical  hernia, which at this time he is interested in having treated.   The patient had a fall in May 2007 and broke two of his metacarpals and  broke his radius in two places.  He was casted but did not require  operative repair.   PAST MEDICAL HISTORY:  Surgical:  1. Tonsillectomy, remote.  2. Repair of a shoulder injury/fracture in 1992.  3. Vasectomy in 1984.   Trauma:  1. Fractured shoulder as noted.  2. Fractured arm as noted.   Medical:  1. The usual childhood disease.  2. Hyperlipidemia.  3. Hypertension.  4. Chronic allergic rhinitis.  5. Chronic anxiety disorder.  6. Recurrent herpes simplex virus type 2.  7. Headache.  8. Dyspepsia.   CURRENT MEDICATIONS:  1. Zovirax 100 mg daily.  2. Allegra 180 mg daily.  3. Lasix 40 mg daily.  4. Paxil CR 25 mg daily.  5. Diltiazem 360 mg daily.  6. Benazepril 40 mg daily.  7. Aspirin 81 mg daily.  8. Allopurinol 300 mg daily for a history of gout.  9. Ranitidine 150 mg b.i.d.  10.Nabumetone 1500 mg q.h.s.  11.Prednisone as needed for gout.  12.Xanax 0.5 mg t.i.d. p.r.n.  13.Tessalon Perles 100 mg t.i.d. p.r.n.  14.Fioricet q.8h. p.r.n.  15.Flonase nasal spray p.r.n.  16.Cialis 20 mg p.r.n.  17.The patient takes multivitamins, vitamin E, vitamin C, and beta      carotenes.   FAMILY HISTORY:  Noncontributory with no significant inheritable  disease.   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  He continues interest in hunting,  fishing, boating, and remains single, a confirmed  bachelor.   REVIEW OF SYSTEMS:  The patient has had no fevers, sweats or chills.  He  has had some mild insomnia secondary to neck stiffness and discomfort  following his fall.  The patient has had an eye exam in the last 24  months an due for a follow-up.  No ENT complaints.  The patient has rare  palpitations, which are asymptomatic.  No respiratory, GI or GU  complaints with no nocturia.  Musculoskeletal review per the HPI.  No  dermatologic or neurologic complaints.   PHYSICAL EXAMINATION:  VITAL SIGNS:  Temperature was 98.9, blood  pressure 118/76, pulse 95, weight 217.  Height is 6 feet.  GENERAL APPEARANCE:  This is a well-nourished, well-developed, mildly  overweight Caucasian gentleman in no acute distress.  HEENT:  Normocephalic, atraumatic.  EACs and TMs were unremarkable.  Oropharynx with native dentition in good repair.  No buccal or palatal  lesions were noted.  The posterior pharynx was clear.  Conjunctivae and  sclerae were clear.  PERRLA, EOMI.  Funduscopic exam was unremarkable.  NECK:  Supple without thyromegaly.  He has good range of motion actively  with no obvious limitation.  NODES:  No adenopathy was noted.  CHEST:  No CVA tenderness.  Lungs were clear to auscultation and  percussion.  CARDIOVASCULAR:  2+ radial pulse.  No JVD or carotid bruits.  He had a  quiet precordium with a regular rate and rhythm without murmurs, rubs or  gallops.  ABDOMEN:  Soft.  The patient has an apricot-sized umbilical hernia which  is soft and easily reducible without tenderness but does seem larger  than at his last visit.  There is no organosplenomegaly, no guarding or  rebound.  GENITALIA:  Normal male phallus, bilaterally descended testicles.  RECTAL:  Normal sphincter tone was noted.  Prostate was smooth and  normal size and contour without nodules.  EXTREMITIES:  Without clubbing, cyanosis, or edema.  No deformities are  noted.  He has had good healing of the fracture of  his right arm with no  visible abnormality or deformity.  NEUROLOGIC:  Nonfocal.  SKIN:  Clear.   DATA BASE:  Hemoglobin 16 g, white count was 8300 with a normal  differential.  Chemistries with a serum glucose of 112, electrolytes  were normal.  Kidney function normal with a kidney function of 0.9 and  GFR of 91.  Liver functions were normal except for a minimally-elevated  alkaline phosphatase of 127.  Cholesterol was 233, triglycerides 196,  HDL was 52.2, LDL was 150.3.  TSH was normal at 1.16.  PSA was normal at  0.60.  Urinalysis was negative.   ASSESSMENT AND PLAN:  1. Hypertension.  The patient's blood pressure is very well-      controlled, so I will continue his present medications.  2. Herpes simplex virus/fever blisters, stable on suppressive therapy.  3. Generalized anxiety disorder.  The patient is stable on his present      regimen using Xanax up to t.i.d. as needed.  Continue with Paxil CR      25 mg.  4. Allergic rhinitis, stable.  5. Gout, stable with no outbreaks on allopurinol.  6. Orthopedic.  Patient with good recovery from his broken arm.  7. Umbilical hernia.  The patient is referred to Dr. Corliss Skains of [facility] with an appointment April 1 at 9:20 a.m., and the      patient is aware of his appointment.  I suspect he would be a good      candidate for repair, hopefully without general anesthesia.  8. Health maintenance.  The patient's last colonoscopy was on September 15, 2005, and normal, and he is good until 2017.  The patient's      last nuclear stress was in 2002 and was normal with no indication      for need for repeat study.  The patient is given Zostavax at      today's visit.  The patient was also given pneumonia vaccine at      today's visit.   In summary, a very pleasant gentleman who seems to be medically stable  at this time.  He has no contraindications or increased risk for any surgery should he and Dr. Corliss Skains decide to pursue  a hernia repair.   The patient is asked to return to see me on a p.r.n. basis or in one  year.     Rosalyn Gess Norins,  MD  Electronically Signed    MEN/MedQ  DD: 09/01/2006  DT: 09/02/2006  Job #: [id number]   cc:   Wilmon Arms. Tsuei, M.D.

## 2010-11-26 ENCOUNTER — Other Ambulatory Visit: Payer: Self-pay | Admitting: Internal Medicine

## 2010-12-01 ENCOUNTER — Other Ambulatory Visit: Payer: Self-pay | Admitting: Internal Medicine

## 2010-12-20 ENCOUNTER — Other Ambulatory Visit: Payer: Self-pay | Admitting: Internal Medicine

## 2010-12-21 ENCOUNTER — Encounter: Payer: Self-pay | Admitting: Internal Medicine

## 2010-12-22 ENCOUNTER — Other Ambulatory Visit: Payer: Self-pay | Admitting: Internal Medicine

## 2010-12-22 ENCOUNTER — Other Ambulatory Visit (INDEPENDENT_AMBULATORY_CARE_PROVIDER_SITE_OTHER): Payer: Medicare Other

## 2010-12-22 ENCOUNTER — Ambulatory Visit (INDEPENDENT_AMBULATORY_CARE_PROVIDER_SITE_OTHER): Payer: Medicare Other | Admitting: Internal Medicine

## 2010-12-22 DIAGNOSIS — Z125 Encounter for screening for malignant neoplasm of prostate: Secondary | ICD-10-CM

## 2010-12-22 DIAGNOSIS — I10 Essential (primary) hypertension: Secondary | ICD-10-CM

## 2010-12-22 DIAGNOSIS — Z0000 Encounter for general adult medical examination without abnormal findings: Secondary | ICD-10-CM

## 2010-12-22 DIAGNOSIS — R51 Headache: Secondary | ICD-10-CM

## 2010-12-22 DIAGNOSIS — M109 Gout, unspecified: Secondary | ICD-10-CM

## 2010-12-22 DIAGNOSIS — F411 Generalized anxiety disorder: Secondary | ICD-10-CM

## 2010-12-22 DIAGNOSIS — E785 Hyperlipidemia, unspecified: Secondary | ICD-10-CM

## 2010-12-22 LAB — HEPATIC FUNCTION PANEL
ALT: 22 U/L (ref 0–53)
AST: 30 U/L (ref 0–37)
Alkaline Phosphatase: 122 U/L — ABNORMAL HIGH (ref 39–117)
Bilirubin, Direct: 0.2 mg/dL (ref 0.0–0.3)
Total Bilirubin: 0.6 mg/dL (ref 0.3–1.2)

## 2010-12-22 LAB — COMPREHENSIVE METABOLIC PANEL
Alkaline Phosphatase: 122 U/L — ABNORMAL HIGH (ref 39–117)
BUN: 15 mg/dL (ref 6–23)
Glucose, Bld: 121 mg/dL — ABNORMAL HIGH (ref 70–99)
Total Bilirubin: 0.6 mg/dL (ref 0.3–1.2)

## 2010-12-22 LAB — LIPID PANEL
HDL: 59.1 mg/dL (ref >39.00)
Triglycerides: 145 mg/dL (ref 0.0–149.0)

## 2010-12-22 MED ORDER — ALPRAZOLAM 0.5 MG PO TABS: 0.5000 mg | ORAL_TABLET | Freq: Three times a day (TID) | ORAL | Status: DC | PRN

## 2010-12-22 MED ORDER — ACYCLOVIR 400 MG PO TABS: 400.0000 mg | ORAL_TABLET | Freq: Two times a day (BID) | ORAL | Status: DC

## 2010-12-22 MED ORDER — RANITIDINE HCL 150 MG PO TABS: 150.0000 mg | ORAL_TABLET | Freq: Two times a day (BID) | ORAL | Status: DC

## 2010-12-22 MED ORDER — SERTRALINE HCL 50 MG PO TABS: 50.0000 mg | ORAL_TABLET | Freq: Every day | ORAL | Status: DC

## 2010-12-22 MED ORDER — SIMVASTATIN 20 MG PO TABS: 20.0000 mg | ORAL_TABLET | Freq: Every evening | ORAL | Status: DC

## 2010-12-22 MED ORDER — TRAMADOL HCL 50 MG PO TABS: ORAL_TABLET | ORAL | Status: DC

## 2010-12-22 MED ORDER — FLUTICASONE PROPIONATE 50 MCG/ACT NA SUSP: 2.0000 | NASAL | Status: DC

## 2010-12-22 MED ORDER — NAPROXEN 500 MG PO TABS: 500.0000 mg | ORAL_TABLET | Freq: Two times a day (BID) | ORAL | Status: DC

## 2010-12-22 MED ORDER — FEXOFENADINE HCL 60 MG PO TABS: 180.0000 mg | ORAL_TABLET | Freq: Every day | ORAL | Status: DC

## 2010-12-22 MED ORDER — DILTIAZEM HCL ER BEADS 360 MG PO CP24: 360.0000 mg | ORAL_CAPSULE | Freq: Every day | ORAL | Status: DC

## 2010-12-22 MED ORDER — FUROSEMIDE 40 MG PO TABS: 40.0000 mg | ORAL_TABLET | Freq: Every day | ORAL | Status: DC

## 2010-12-22 MED ORDER — ALLOPURINOL 300 MG PO TABS: 300.0000 mg | ORAL_TABLET | Freq: Every day | ORAL | Status: DC

## 2010-12-22 MED ORDER — BENAZEPRIL HCL 40 MG PO TABS: 40.0000 mg | ORAL_TABLET | Freq: Every day | ORAL | Status: DC

## 2010-12-22 NOTE — Progress Notes (Signed)
Subjective:    Patient ID: Shaun Carey, male    DOB: [birth year], 70 y.o.   MRN: [id number]  HPI   The patient is here for annual Medicare wellness examination and management of other chronic and acute problems. He continues to have knee pain, especially with effort. Knee braces do help.    The risk factors are reflected in the social history.  The roster of all physicians providing medical care to patient - is listed in the Snapshot section of the chart.  Activities of daily living:  The patient is 100% inedpendent in all ADLs: dressing, toileting, feeding as well as independent mobility  Home safety : The patient has smoke detectors in the home. They wear seatbelts. firearms are present in the home, kept in a safe fashion. There is no violence in the home.   There is no risks for hepatitis, STDs or HIV. There is no   history of blood transfusion. They have no travel history to infectious disease endemic areas of the world.  The patient has seen their dentist in the last six month. They have seen their eye doctor in the last year. He will be returning to Dr. Dione Booze for evaluation. They deny any hearing difficulty and have not had audiologic testing in the last year.  They do not  have excessive sun exposure. Discussed the need for sun protection: hats, long sleeves and use of sunscreen if there is significant sun exposure.   Diet: the importance of a healthy diet is discussed. They do have a healthy diet.  The patient does not currently have a regular exercise program but did mention that he just joined the [facility] and will start within the next month.  The benefits of regular aerobic exercise were discussed.  Depression screen: there are no signs or vegative symptoms of depression- irritability, change in appetite, anhedonia, sadness/tearfullness.  Cognitive assessment: the patient manages all their financial and personal affairs and is actively engaged. They could relate day,date,year  and events; recalled 3/3 objects at 3 minutes; performed clock-face test normally.  The following portions of the patient's history were reviewed and updated as appropriate: allergies, current medications, past family history, past medical history,  past surgical history, past social history  and problem list.  Vision, hearing, body mass index were assessed and reviewed.   During the course of the visit the patient was educated and counseled about appropriate screening and preventive services including : fall prevention , diabetes screening, nutrition counseling, colorectal cancer screening, and recommended immunizations.   Review of Systems Review of Systems  Constitutional:  Negative for fever, chills, activity change and unexpected weight change.  HEENT:  Negative for hearing loss, ear pain, congestion, neck stiffness and postnasal drip. Negative for sore throat or swallowing problems. Negative for dental complaints.   Eyes: Negative for vision loss or change in visual acuity.  Respiratory: Negative for chest tightness and wheezing.   Cardiovascular: Negative for chest pain and palpitation. No decreased exercise tolerance Gastrointestinal: No change in bowel habit. No bloating or gas. No reflux or indigestion Genitourinary: Negative for urgency, frequency, flank pain and difficulty urinating.  Musculoskeletal: Negative for myalgias, back pain, arthralgias and gait problem.  Neurological: Negative for dizziness, tremors, weakness and headaches.  Hematological: Negative for adenopathy.  Psychiatric/Behavioral: Negative for behavioral problems and dysphoric mood.        Objective:   Physical Exam Vital signs reviewed Gen'l: Well nourished well developed white male in no acute distress  HENT:  Head: Normocephalic and atraumatic. 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 EAC/TM nl Left Ear: External ear normal.  EAC/TM nl Nose: Nose normal.  Mouth/Throat: Oropharynx  is clear and moist. Dentition - native, in good repair thoug missing several teeth on the mandible.. No buccal or palatal lesions. Posterior pharynx clear. Eyes: Conjunctivae and sclera clear. EOM intact. Pupils are equal, round, and reactive to light. Right eye exhibits no discharge. Left eye exhibits no discharge. Neck: Normal range of motion. Neck supple. No JVD present. No tracheal deviation present. No thyromegaly present.  Cardiovascular: Normal rate, regular rhythm, no gallop, no friction rub, no murmur heard.      Quiet precordium. 2+ radial and DP pulses . No carotid bruits Pulmonary/Chest: Effort normal. No respiratory distress or increased WOB, no wheezes, no rales. No chest wall deformity or CVAT. Abdominal: Soft. Bowel sounds are normal in all quadrants. He exhibits no distension, no tenderness, no rebound or guarding, No heptosplenomegaly  Genitourinary:  deferred Musculoskeletal: Normal range of motion. He exhibits no edema and no tenderness.       Small and large joints without redness, synovial thickening or deformity. Full range of motion preserved about all small, median and large joints.  Lymphadenopathy:    He has no cervical or supraclavicular adenopathy.  Neurological: He is alert and oriented to person, place, and time. CN II-XII intact. DTRs 2+ and symmetrical biceps, radial and patellar tendons. Cerebellar function normal with no tremor, rigidity, normal gait and station.  Skin: Skin is warm and dry. No rash noted. No erythema.  Psychiatric: He has a normal mood and affect. His behavior is normal. Thought content normal.   Lab Results  Component Value Date   WBC 7.7 12/17/2009   HGB 13.4 12/17/2009   HCT 38.5* 12/17/2009   PLT 177.0 12/17/2009   CHOL 206* 12/22/2010   TRIG 145.0 12/22/2010   HDL 59.10 12/22/2010   LDLDIRECT 146.6 12/22/2010   ALT 22 12/22/2010   ALT 22 12/22/2010   AST 30 12/22/2010   AST 30 12/22/2010   NA 141 12/22/2010   K 3.6 12/22/2010   CL 103 12/22/2010    CREATININE 0.9 12/22/2010   BUN 15 12/22/2010   CO2 30 12/22/2010   TSH 1.79 12/22/2010   PSA 1.20 12/22/2010           Assessment & Plan:

## 2010-12-23 DIAGNOSIS — Z0000 Encounter for general adult medical examination without abnormal findings: Secondary | ICD-10-CM | POA: Insufficient documentation

## 2010-12-23 NOTE — Assessment & Plan Note (Signed)
No recent flares.   Plan - check uric acid level to assess efficacy of allopurinol  Addendum - Uric Acid 4.1 = low normal range. Will continue present dose of allopurinol.

## 2010-12-23 NOTE — Assessment & Plan Note (Signed)
BP Readings from Last 3 Encounters:  12/22/10 146/76  12/17/09 132/70  12/10/08 124/64   Borderline control on present medications.  Plan - home monitoring of BP with report back. For SBP consistently >140 will need to adjust medications.

## 2010-12-23 NOTE — Assessment & Plan Note (Signed)
Still with occasional headache. Was using butalbital/caffein/apap.  Plan - will be starting tamadol and d/c fioricet.

## 2010-12-23 NOTE — Assessment & Plan Note (Signed)
Interval medical history is unremarkable. Physical exam is normal. Lab results, except for LDL, are in normal limits. He is current for colorectal cancer screening with last exam in March '07. PSA 1.6 and with normal acceleration. Immunizations: Pneumonia March '08, shingles Machc '09; Tdap - today.   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 All meds reviewed and changes made for better cost and better effectiveness. He will return for lab in one month. He will otherwise return prn or in 1 year.

## 2010-12-23 NOTE — Assessment & Plan Note (Signed)
Patient has been taking red yeast rice. Lipid panel with mildly elevated LDL above goal of 130 or less, preferrably less than 100.  Plan - change to simvastatin 20mg  qd.           Follow-up lab in 3-4 weeks.

## 2010-12-23 NOTE — Assessment & Plan Note (Signed)
discussed alternative medications to benzodiazepines for control of symptoms.  Plan - start sertraline 50 mg daily           Continue alprazolam prn

## 2010-12-24 ENCOUNTER — Encounter: Payer: Self-pay | Admitting: Internal Medicine

## 2011-01-07 ENCOUNTER — Other Ambulatory Visit: Payer: Self-pay | Admitting: Internal Medicine

## 2011-01-22 ENCOUNTER — Telehealth: Payer: Self-pay | Admitting: *Deleted

## 2011-01-22 NOTE — Telephone Encounter (Signed)
Thanks. Seems the scheduler was perceived as being abrasive.

## 2011-01-22 NOTE — Telephone Encounter (Signed)
Pt left VM.Marland KitchenMarland KitchenMarland KitchenHe simply wants to know if there are lab orders for his recheck of lipids. (YES)  BUT he is very upset and left a disrespectful message on triage stating "some of you people to get off your a** and answer the phone", also he is upset that no one called to tell him if the lab orders were ready. He "expects a d**m reply today". I transferred the message to Vicie Mutters (office manager) and Dr Debby Bud.   Dr Debby Bud - do you mind calling patient to tell him that the orders were entered?

## 2011-01-22 NOTE — Telephone Encounter (Signed)
I informed patient that lab orders were waiting and he could come anytime. He complained that the scheduler he spoke to was "abrasive". Per pt - he called to find out about lab orders b/c he came in the past, there were no orders and the Dr was "having a fine lunch with [employer]" so pt had to come back at a later date to have labs.

## 2011-01-28 ENCOUNTER — Other Ambulatory Visit (INDEPENDENT_AMBULATORY_CARE_PROVIDER_SITE_OTHER): Payer: Medicare Other

## 2011-01-28 DIAGNOSIS — E785 Hyperlipidemia, unspecified: Secondary | ICD-10-CM

## 2011-01-28 LAB — HEPATIC FUNCTION PANEL
AST: 36 U/L (ref 0–37)
Albumin: 4.4 g/dL (ref 3.5–5.2)
Total Bilirubin: 0.7 mg/dL (ref 0.3–1.2)

## 2011-01-28 LAB — LIPID PANEL
Cholesterol: 163 mg/dL (ref 0–200)
LDL Cholesterol: 92 mg/dL (ref 0–99)
Triglycerides: 50 mg/dL (ref 0.0–149.0)
VLDL: 10 mg/dL (ref 0.0–40.0)

## 2011-02-01 ENCOUNTER — Encounter: Payer: Self-pay | Admitting: Internal Medicine

## 2011-03-07 ENCOUNTER — Other Ambulatory Visit: Payer: Self-pay | Admitting: Internal Medicine

## 2011-03-08 NOTE — Telephone Encounter (Signed)
Please Advise in Dr Norins absence  

## 2011-03-09 MED ORDER — ALPRAZOLAM 0.5 MG PO TABS: 0.5000 mg | ORAL_TABLET | Freq: Three times a day (TID) | ORAL | Status: DC | PRN

## 2011-05-27 ENCOUNTER — Other Ambulatory Visit: Payer: Self-pay | Admitting: Internal Medicine

## 2011-07-14 ENCOUNTER — Other Ambulatory Visit: Payer: Self-pay | Admitting: Internal Medicine

## 2011-08-11 ENCOUNTER — Other Ambulatory Visit: Payer: Self-pay | Admitting: Internal Medicine

## 2011-08-11 NOTE — Telephone Encounter (Signed)
Alprazolam request [last refill 09.18.12 #[id number]]

## 2011-08-12 NOTE — Telephone Encounter (Signed)
Phoned in to pharmacy/SLS 

## 2011-08-27 ENCOUNTER — Other Ambulatory Visit: Payer: Self-pay | Admitting: Internal Medicine

## 2011-08-31 ENCOUNTER — Other Ambulatory Visit: Payer: Self-pay | Admitting: *Deleted

## 2011-08-31 MED ORDER — BENAZEPRIL HCL 40 MG PO TABS: 40.0000 mg | ORAL_TABLET | Freq: Every day | ORAL | Status: DC

## 2011-08-31 NOTE — Telephone Encounter (Signed)
Done

## 2011-09-12 ENCOUNTER — Other Ambulatory Visit: Payer: Self-pay | Admitting: Internal Medicine

## 2011-09-14 ENCOUNTER — Other Ambulatory Visit: Payer: Self-pay | Admitting: *Deleted

## 2011-09-14 NOTE — Telephone Encounter (Signed)
Tramadol request [last refill 07.03.2012 #[id number]]

## 2011-09-14 NOTE — Telephone Encounter (Signed)
Ok for refill  x3 

## 2011-09-14 NOTE — Telephone Encounter (Signed)
Tramadol request [last refill 07.03.12 #[id number]]

## 2011-09-15 MED ORDER — TRAMADOL HCL 50 MG PO TABS: ORAL_TABLET | ORAL | Status: DC

## 2011-09-15 NOTE — Telephone Encounter (Signed)
Done

## 2011-12-28 ENCOUNTER — Ambulatory Visit (INDEPENDENT_AMBULATORY_CARE_PROVIDER_SITE_OTHER): Payer: Medicare Other | Admitting: Internal Medicine

## 2011-12-28 ENCOUNTER — Encounter: Payer: Self-pay | Admitting: Internal Medicine

## 2011-12-28 ENCOUNTER — Other Ambulatory Visit (INDEPENDENT_AMBULATORY_CARE_PROVIDER_SITE_OTHER): Payer: Medicare Other

## 2011-12-28 VITALS — BP 132/78 | HR 72 | Temp 97.4°F | Resp 16 | Ht 72.0 in | Wt 211.0 lb

## 2011-12-28 DIAGNOSIS — Z0000 Encounter for general adult medical examination without abnormal findings: Secondary | ICD-10-CM

## 2011-12-28 DIAGNOSIS — M1710 Unilateral primary osteoarthritis, unspecified knee: Secondary | ICD-10-CM

## 2011-12-28 DIAGNOSIS — M109 Gout, unspecified: Secondary | ICD-10-CM

## 2011-12-28 DIAGNOSIS — I10 Essential (primary) hypertension: Secondary | ICD-10-CM

## 2011-12-28 DIAGNOSIS — E785 Hyperlipidemia, unspecified: Secondary | ICD-10-CM

## 2011-12-28 DIAGNOSIS — F411 Generalized anxiety disorder: Secondary | ICD-10-CM

## 2011-12-28 DIAGNOSIS — T887XXA Unspecified adverse effect of drug or medicament, initial encounter: Secondary | ICD-10-CM

## 2011-12-28 DIAGNOSIS — R011 Cardiac murmur, unspecified: Secondary | ICD-10-CM

## 2011-12-28 DIAGNOSIS — IMO0002 Reserved for concepts with insufficient information to code with codable children: Secondary | ICD-10-CM

## 2011-12-28 LAB — CBC WITH DIFFERENTIAL/PLATELET
Basophils Absolute: 0 10*3/uL (ref 0.0–0.1)
Eosinophils Absolute: 0.1 10*3/uL (ref 0.0–0.7)
HCT: 41.5 % (ref 39.0–52.0)
Hemoglobin: 13.9 g/dL (ref 13.0–17.0)
Lymphs Abs: 1.6 10*3/uL (ref 0.7–4.0)
MCHC: 33.5 g/dL (ref 30.0–36.0)
MCV: 93.4 fl (ref 78.0–100.0)
Monocytes Absolute: 0.9 10*3/uL (ref 0.1–1.0)
Neutro Abs: 5.3 10*3/uL (ref 1.4–7.7)
Platelets: 171 10*3/uL (ref 150.0–400.0)
RDW: 13.5 % (ref 11.5–14.6)

## 2011-12-28 LAB — COMPREHENSIVE METABOLIC PANEL
AST: 40 U/L — ABNORMAL HIGH (ref 0–37)
Albumin: 4.5 g/dL (ref 3.5–5.2)
Alkaline Phosphatase: 115 U/L (ref 39–117)
BUN: 7 mg/dL (ref 6–23)
Glucose, Bld: 117 mg/dL — ABNORMAL HIGH (ref 70–99)
Potassium: 4.2 mEq/L (ref 3.5–5.1)
Sodium: 142 mEq/L (ref 135–145)
Total Bilirubin: 1.3 mg/dL — ABNORMAL HIGH (ref 0.3–1.2)

## 2011-12-28 LAB — HEPATIC FUNCTION PANEL
ALT: 26 U/L (ref 0–53)
AST: 40 U/L — ABNORMAL HIGH (ref 0–37)
Alkaline Phosphatase: 115 U/L (ref 39–117)
Total Bilirubin: 1.3 mg/dL — ABNORMAL HIGH (ref 0.3–1.2)

## 2011-12-28 LAB — TSH: TSH: 1.89 u[IU]/mL (ref 0.35–5.50)

## 2011-12-28 LAB — LIPID PANEL
HDL: 75.4 mg/dL (ref >39.00)
Total CHOL/HDL Ratio: 2
VLDL: 14.4 mg/dL (ref 0.0–40.0)

## 2011-12-28 LAB — URIC ACID: Uric Acid, Serum: 3.1 mg/dL — ABNORMAL LOW (ref 4.0–7.8)

## 2011-12-28 NOTE — Assessment & Plan Note (Addendum)
Interval history is benign. Physical exam sans prostate is normal. Labs - normal except for lipids. Current with colorectal cancer screening and for prostate cancer: iscussed pros and cons of prostate cancer screening (USPHCTF recommendations reviewed and ACU April '13 recommendations) and he defers evaluation at this time due to normal PSA in '12 and a history of stable PSAs. Immunizations are up to date except no record of tetanus.  IN summary - a nice man who appears to be medically stable.  He will return in 1 year or prn. Marland Kitchen

## 2011-12-28 NOTE — Assessment & Plan Note (Signed)
Holding his own. He has reduced some activities that were stressful for his knees. And he continues to take glucosamine.  Plan Keep with his current regimen.

## 2011-12-28 NOTE — Progress Notes (Signed)
Subjective:    Patient ID: Shaun Carey, male    DOB: 1941-03-10, 71 y.o.   MRN: [id number]  HPI Shaun Carey  is here for annual Medicare wellness examination and management of other chronic and acute problems.  In the interval since his last visit he has not had any major medical problems. He was intolerant of Sertraline, and has stopped fexofenedine.   The risk factors are reflected in the social history.  The roster of all physicians providing medical care to patient - is listed in the Snapshot section of the chart.  Activities of daily living:  The patient is 100% inedpendent in all ADLs: dressing, toileting, feeding as well as independent mobility  Home safety : The patient has smoke detectors in the home. Fall- fall safe.They wear seatbelts. firearms are present in the home, kept in a safe fashion. There is no violence in the home.   There is no risks for hepatitis, STDs or HIV. There is no   history of blood transfusion. They have no travel history to infectious disease endemic areas of the world.  The patient has seen their dentist in the last six month. They have seen their eye doctor in the last year. They deny any hearing difficulty and have not had audiologic testing in the last year.  They do not  have excessive sun exposure. Discussed the need for sun protection: hats, long sleeves and use of sunscreen if there is significant sun exposure.   Diet: the importance of a healthy diet is discussed. They do have a healthy diet.  The patient has a regular exercise program: cardio and machine work-out , 60 min duration, 2 per week.  The benefits of regular aerobic exercise were discussed.  Depression screen: there are no signs or vegative symptoms of depression- irritability, change in appetite, anhedonia, sadness/tearfullness.  Cognitive assessment: the patient manages all their financial and personal affairs and is actively engaged.   The following portions of the patient's  history were reviewed and updated as appropriate: allergies, current medications, past family history, past medical history,  past surgical history, past social history  and problem list.  Vision, hearing, body mass index were assessed and reviewed.   During the course of the visit the patient was educated and counseled about appropriate screening and preventive services including : fall prevention , diabetes screening, nutrition counseling, colorectal cancer screening, and recommended immunizations.  Past Medical History  Diagnosis Date  . Hypertension   . Hyperlipidemia   . Anxiety    No past surgical history on file. Family History  Problem Relation Age of Onset  . Alzheimer's disease Mother   . Glaucoma Mother   . Cancer Neg Hx   . Diabetes Neg Hx   . Heart disease Brother     murmur   History   Social History  . Marital Status: Divorced    Spouse Name: N/A    Number of Children: 2  . Years of Education: 18   Occupational History  . retail pharmacist     retired   Social History Main Topics  . Smoking status: Former Smoker    Quit date: 12/28/1962  . Smokeless tobacco: Never Used  . Alcohol Use: 1.0 oz/week    2 drink(s) per week  . Drug Use: No  . Sexually Active: No   Other Topics Concern  . Not on file   Social History Narrative   [facility], married '65- 13 divorced;married '80-8  yrs widowed;married '95 -21 mos divorced. 1 daughter '70; 1 son '72.   Current Outpatient Prescriptions on File Prior to Visit  Medication Sig Dispense Refill  . allopurinol (ZYLOPRIM) 300 MG tablet TAKE 1 TABLET BY MOUTH ONCE A DAY  90 tablet  1  . ALPRAZolam (XANAX) 0.5 MG tablet TAKE ONE TABLET BY MOUTH THREE TIMES DAILY AS NEEDED  270 tablet  1  . aspirin 81 MG tablet Take 81 mg by mouth daily.        . benazepril (LOTENSIN) 40 MG tablet Take 1 tablet (40 mg total) by mouth daily.  90 tablet  1  . Coenzyme Q10 (CO Q-10) 200 MG CAPS Take by mouth  daily.        Marland Kitchen diltiazem (TIAZAC) 360 MG 24 hr capsule TAKE ONE CAPSULE BY MOUTH DAILY  90 capsule  1  . fluticasone (FLONASE) 50 MCG/ACT nasal spray Place 2 sprays into the nose as directed.  16 g  3  . furosemide (LASIX) 40 MG tablet TAKE 1 TABLET BY MOUTH ONCE A DAY  90 tablet  0  . Multiple Vitamin (MULTIVITAMIN) tablet Take 1 tablet by mouth daily.        . naproxen (NAPROSYN) 500 MG tablet TAKE ONE TABLET BY MOUTH TWICE DAILY WITH MEALS  180 tablet  2  . NON FORMULARY Osteo bioflex        . ranitidine (ZANTAC) 150 MG tablet Take 1 tablet (150 mg total) by mouth 2 (two) times daily.  180 tablet  3  . simvastatin (ZOCOR) 20 MG tablet TAKE 1 TABLET BY MOUTH EVERY EVENING  90 tablet  2  . tadalafil (CIALIS) 20 MG tablet Take 20 mg by mouth daily as needed.        . traMADol (ULTRAM) 50 MG tablet Take 1 to 2 tablets by mouth every 6 hours as needed for arthritic pain.  180 tablet  3  . vitamin E 400 UNIT capsule Take 400 Units by mouth daily.        . Vitamins-Lipotropics (B-100) TABS Take by mouth daily.        . fexofenadine (ALLEGRA) 60 MG tablet Take 3 tablets (180 mg total) by mouth daily.  90 tablet  3  .            Review of Systems Constitutional:  Negative for fever, chills, activity change and unexpected weight change.  HEENT:  Negative for hearing loss, ear pain, congestion, neck stiffness and postnasal drip. Negative for sore throat or swallowing problems. Negative for dental complaints.   Eyes: Negative for vision loss or change in visual acuity.  Respiratory: Negative for chest tightness and wheezing. Negative for DOE.   Cardiovascular: Negative for chest pain or palpitations. No decreased exercise tolerance Gastrointestinal: No change in bowel habit. No bloating or gas. No reflux or indigestion Genitourinary: Negative for urgency, frequency, flank pain and difficulty urinating.  Musculoskeletal: Negative for myalgias, back pain, arthralgias and gait problem.    Neurological: Negative for dizziness, tremors, weakness and headaches.  Hematological: Negative for adenopathy.  Psychiatric/Behavioral: Negative for behavioral problems and dysphoric mood.       Objective:   Physical Exam Filed Vitals:   12/28/11 1339  BP: 132/78  Pulse: 72  Temp: 97.4 F (36.3 C)  Resp: 16   Wt Readings from Last 3 Encounters:  12/28/11 211 lb (95.709 kg)  12/22/10 215 lb (97.523 kg)  12/17/09 208 lb (94.348 kg)   Gen'l: Well nourished well  developed white male in no acute distress  HEENT: Head: Normocephalic and atraumatic. Right Ear: External ear normal. EAC/TM nl. Left Ear: External ear normal.  EAC/TM nl. Nose: Nose normal. Mouth/Throat: Oropharynx is clear and moist. Dentition - native, in good repair. No buccal or palatal lesions. Posterior pharynx clear. Eyes: Conjunctivae and sclera clear. EOM intact. Pupils are equal, round, and reactive to light. Right eye exhibits no discharge. Left eye exhibits no discharge. Neck: Normal range of motion. Neck supple. No JVD present. No tracheal deviation present. No thyromegaly present.  Cardiovascular: Normal rate, regular rhythm, no gallop, no friction rub, no murmur heard.      Quiet precordium. 2+ radial and DP pulses . No carotid bruits Pulmonary/Chest: Effort normal. No respiratory distress or increased WOB, no wheezes, no rales. No chest wall deformity or CVAT. Abdominal: Soft. Bowel sounds are normal in all quadrants. He exhibits no distension, no tenderness, no rebound or guarding, No heptosplenomegaly  Genitourinary:  deferred Musculoskeletal: Normal range of motion. He exhibits no edema and no tenderness.       Small and large joints without redness, synovial thickening or deformity. Full range of motion preserved about all small, median and large joints. Knees are stable without effusion, normal ROM, click with position change. Lymphadenopathy:    He has no cervical or supraclavicular adenopathy.   Neurological: He is alert and oriented to person, place, and time. CN II-XII intact. DTRs 2+ and symmetrical biceps, radial and patellar tendons. Cerebellar function normal with no tremor, rigidity, normal gait and station.  Skin: Skin is warm and dry. No rash noted. No erythema.  Psychiatric: He has a normal mood and affect. His behavior is normal. Thought content normal.   Lab Results  Component Value Date   WBC 8.0 12/28/2011   HGB 13.9 12/28/2011   HCT 41.5 12/28/2011   PLT 171.0 12/28/2011   GLUCOSE 117* 12/28/2011   CHOL 164 12/28/2011   TRIG 72.0 12/28/2011   HDL 75.40 12/28/2011   LDLDIRECT 146.6 12/22/2010   LDLCALC 74 12/28/2011        ALT 26 12/28/2011   AST 40* 12/28/2011        NA 142 12/28/2011   K 4.2 12/28/2011   CL 101 12/28/2011   CREATININE 0.9 12/28/2011   BUN 7 12/28/2011   CO2 32 12/28/2011   TSH 1.89 12/28/2011   PSA 1.20 12/22/2010         Assessment & Plan:

## 2011-12-28 NOTE — Assessment & Plan Note (Addendum)
No flares in the interval since his last exam.  Plan Continue allopurinol  Uric acid level pending. Adjustment in medication as indicated.  Addendum - Uric Acid 3.1 = excellent

## 2011-12-28 NOTE — Assessment & Plan Note (Signed)
Not heard at today's exam. Prior echo in 2011 was negative.

## 2011-12-28 NOTE — Assessment & Plan Note (Signed)
BP Readings from Last 3 Encounters:  12/28/11 132/78  12/22/10 146/76  12/17/09 132/70   Adequate control on his present medications.  Plan  Routine metabolic panel  Continue present medications.

## 2011-12-28 NOTE — Assessment & Plan Note (Addendum)
Last lipid panel in '12 with LDL of 92. He has tolerated low dose simvastatin without adverse effects.  Plan Routine follow-up lab with recommendations as indicated.  Addendum: LDL 146! Out of control when previously very well controlled  Plan No change in medications  Patient to resume previous level of physical exercise and dietary discretion.  Repeat lipid panel in  6 months

## 2011-12-28 NOTE — Assessment & Plan Note (Signed)
Doing well. He did not tolerate sertraline and does not want to resume paxil. He feels he is doing OK without medication. A major stressor and cause for anxiety disappeared at the time of retirement.

## 2012-01-04 ENCOUNTER — Other Ambulatory Visit: Payer: Self-pay | Admitting: *Deleted

## 2012-01-04 ENCOUNTER — Other Ambulatory Visit: Payer: Self-pay | Admitting: Internal Medicine

## 2012-01-04 MED ORDER — BENAZEPRIL HCL 40 MG PO TABS: 40.0000 mg | ORAL_TABLET | Freq: Every day | ORAL | Status: DC

## 2012-01-04 MED ORDER — ASPIRIN 81 MG PO TABS: 81.0000 mg | ORAL_TABLET | Freq: Every day | ORAL | Status: DC

## 2012-01-04 MED ORDER — TADALAFIL 20 MG PO TABS: 20.0000 mg | ORAL_TABLET | Freq: Every day | ORAL | Status: DC | PRN

## 2012-01-04 MED ORDER — RANITIDINE HCL 150 MG PO TABS: 150.0000 mg | ORAL_TABLET | Freq: Two times a day (BID) | ORAL | Status: DC

## 2012-01-04 MED ORDER — ALPRAZOLAM 0.5 MG PO TABS: 0.5000 mg | ORAL_TABLET | Freq: Three times a day (TID) | ORAL | Status: DC | PRN

## 2012-01-04 MED ORDER — FLUTICASONE PROPIONATE 50 MCG/ACT NA SUSP: 2.0000 | NASAL | Status: DC

## 2012-01-04 MED ORDER — FUROSEMIDE 40 MG PO TABS: 40.0000 mg | ORAL_TABLET | Freq: Every day | ORAL | Status: DC

## 2012-01-04 MED ORDER — VITAMIN C 100 MG PO TABS: 100.0000 mg | ORAL_TABLET | Freq: Every day | ORAL | Status: DC

## 2012-01-04 MED ORDER — DILTIAZEM HCL ER BEADS 360 MG PO CP24: 360.0000 mg | ORAL_CAPSULE | Freq: Every day | ORAL | Status: DC

## 2012-01-04 MED ORDER — NAPROXEN 500 MG PO TABS: 500.0000 mg | ORAL_TABLET | Freq: Two times a day (BID) | ORAL | Status: DC

## 2012-01-04 MED ORDER — ONE-DAILY MULTI VITAMINS PO TABS: 1.0000 | ORAL_TABLET | Freq: Every day | ORAL | Status: DC

## 2012-01-04 MED ORDER — CO Q-10 200 MG PO CAPS: 200.0000 mg | ORAL_CAPSULE | Freq: Every day | ORAL | Status: DC

## 2012-01-04 MED ORDER — SIMVASTATIN 20 MG PO TABS: 20.0000 mg | ORAL_TABLET | Freq: Every day | ORAL | Status: DC

## 2012-01-04 MED ORDER — ALLOPURINOL 300 MG PO TABS: 300.0000 mg | ORAL_TABLET | Freq: Every day | ORAL | Status: DC

## 2012-01-04 MED ORDER — TRAMADOL HCL 50 MG PO TABS: ORAL_TABLET | ORAL | Status: DC

## 2012-01-04 NOTE — Telephone Encounter (Signed)
Rxs refilled and sent to [facility].

## 2012-01-05 NOTE — Telephone Encounter (Signed)
Rx sent and faxed to [facility] for acyclovir and alprazalam

## 2012-02-14 ENCOUNTER — Encounter: Payer: Self-pay | Admitting: Internal Medicine

## 2012-02-14 ENCOUNTER — Ambulatory Visit (INDEPENDENT_AMBULATORY_CARE_PROVIDER_SITE_OTHER): Payer: Medicare Other | Admitting: Internal Medicine

## 2012-02-14 VITALS — BP 162/88 | HR 83 | Temp 97.8°F | Resp 16 | Wt 208.0 lb

## 2012-02-14 DIAGNOSIS — M1710 Unilateral primary osteoarthritis, unspecified knee: Secondary | ICD-10-CM

## 2012-02-14 DIAGNOSIS — Z820 Family history of epilepsy and other diseases of the nervous system: Secondary | ICD-10-CM

## 2012-02-14 NOTE — Progress Notes (Signed)
Subjective:    Patient ID: Shaun Carey, male    DOB: [birth year], 71 y.o.   MRN: [id number]  HPI Patient presents to discuss multiple issues: 1. ACP - see social history: dtr is POA, Wants full support and care. 2. Long term care insurance - he was denied due to need for bilateral TKR. However, he has seen real improvement with a change in life -style: off his feet more, not sailing, and is careful. He did have hyaluronidase injections which were very helpful. He has not seen Dr. Farris Has in 3 years and he is not a candidate for TKR.  3. Dementia and memory loss: strong family h/o alzheimer's disease and family has concerns about prevention and minimal memory loss he wishes to take aricept on a proactive, prophylactic basis. 4. He reports BP when monitored at home is in the 140s' /80s on a consistent basis.   Past Medical History  Diagnosis Date  . Hypertension   . Hyperlipidemia   . Anxiety    No past surgical history on file. Family History  Problem Relation Age of Onset  . Alzheimer's disease Mother   . Glaucoma Mother   . Cancer Neg Hx   . Diabetes Neg Hx   . Heart disease Brother     murmur   History   Social History  . Marital Status: Divorced    Spouse Name: N/A    Number of Children: 2  . Years of Education: 18   Occupational History  . retail pharmacist     retired   Social History Main Topics  . Smoking status: Former Smoker    Quit date: 12/28/1962  . Smokeless tobacco: Never Used  . Alcohol Use: 1.0 oz/week    2 drink(s) per week  . Drug Use: No  . Sexually Active: No   Other Topics Concern  . Not on file   Social History Narrative   [facility], married '65- 13 divorced;married '80-8 yrs widowed;married '95 -21 mos divorced. 1 daughter '70; 1 son '72. HCPOA - dtr - Michelle Mcginley (c). ACD - Yes - CPR, does wish Mechanical ventilation if needed, does want prolonged artificial hydration and nutrition if needed. If he is in a  vegative state he would want withdrawal of all life support.     Current Outpatient Prescriptions on File Prior to Visit  Medication Sig Dispense Refill  . acyclovir (ZOVIRAX) 400 MG tablet TAKE ONE TABLET BY MOUTH TWICE DAILY  180 tablet  3  . allopurinol (ZYLOPRIM) 300 MG tablet Take 1 tablet (300 mg total) by mouth daily.  90 tablet  3  . ALPRAZolam (XANAX) 0.5 MG tablet Take 1 tablet (0.5 mg total) by mouth 3 (three) times daily as needed for sleep.  270 tablet  3  . Ascorbic Acid (VITAMIN C) 100 MG tablet Take 1 tablet (100 mg total) by mouth daily.  90 tablet  3  . aspirin 81 MG tablet Take 1 tablet (81 mg total) by mouth daily.  90 tablet  3  . benazepril (LOTENSIN) 40 MG tablet Take 1 tablet (40 mg total) by mouth daily.  90 tablet  3  . Cholecalciferol (VITAMIN D-3 PO) Take 1 capsule by mouth daily.      . Coenzyme Q10 (CO Q-10) 200 MG CAPS Take 200 mg by mouth daily.  90 each  3  . diltiazem (TIAZAC) 360 MG 24 hr capsule Take 1 capsule (360 mg total) by mouth daily.  90 capsule  3  . fluticasone (FLONASE) 50 MCG/ACT nasal spray Place 2 sprays into the nose as directed.  16 g  3  . furosemide (LASIX) 40 MG tablet Take 1 tablet (40 mg total) by mouth daily.  90 tablet  3  . Multiple Vitamin (MULTIVITAMIN) tablet Take 1 tablet by mouth daily.  90 tablet  3  . naproxen (NAPROSYN) 500 MG tablet Take 1 tablet (500 mg total) by mouth 2 (two) times daily with a meal.  180 tablet  3  . NON FORMULARY Osteo bioflex        . OMEGA-3 KRILL OIL PO Take 1 capsule by mouth daily.      . ranitidine (ZANTAC) 150 MG tablet Take 1 tablet (150 mg total) by mouth 2 (two) times daily.  180 tablet  3  . simvastatin (ZOCOR) 20 MG tablet Take 1 tablet (20 mg total) by mouth at bedtime.  90 tablet  3  . tadalafil (CIALIS) 20 MG tablet Take 1 tablet (20 mg total) by mouth daily as needed.  10 tablet  3  . traMADol (ULTRAM) 50 MG tablet Take 1 to 2 tablets by mouth every 6 hours as needed for arthritic pain.   180 tablet  3  . vitamin E 400 UNIT capsule Take 400 Units by mouth daily.        . Vitamins-Lipotropics (B-100) TABS Take by mouth daily.        . fexofenadine (ALLEGRA) 60 MG tablet Take 3 tablets (180 mg total) by mouth daily.  90 tablet  3  . sertraline (ZOLOFT) 50 MG tablet Take 1 tablet (50 mg total) by mouth daily.  90 tablet  3      Review of Systems System review is negative for any constitutional, cardiac, pulmonary, GI or neuro symptoms or complaints other than as described in the HPI.     Objective:   Physical Exam Filed Vitals:   02/14/12 0948  BP: 162/88  Pulse: 83  Temp: 97.8 F (36.6 C)  Resp: 16   Gen'l- WNWD white man in no distress Neuro - highly functional with no evidence of memory loss.        Assessment & Plan:  Family history of dementia - He does not have any objective signs of memory loss or any indication for medical therapy.   (greater than 50% of 20 min visit spent on education and counseling)

## 2012-02-14 NOTE — Assessment & Plan Note (Signed)
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 He had Supartz injections; he has a life-style which frees him from long hours on his feet which was an exacerbating factor as was sailing. He now has no limitations in his activities and takes minimal medication.

## 2012-02-25 ENCOUNTER — Telehealth: Payer: Self-pay | Admitting: *Deleted

## 2012-03-01 NOTE — Telephone Encounter (Signed)
Rx sent to [facility] pharmacy

## 2012-05-19 ENCOUNTER — Other Ambulatory Visit: Payer: Self-pay | Admitting: Internal Medicine

## 2012-07-25 ENCOUNTER — Other Ambulatory Visit: Payer: Self-pay | Admitting: Internal Medicine

## 2012-08-25 ENCOUNTER — Other Ambulatory Visit: Payer: Self-pay | Admitting: Internal Medicine

## 2012-09-27 ENCOUNTER — Other Ambulatory Visit: Payer: Self-pay | Admitting: Internal Medicine

## 2012-09-28 ENCOUNTER — Telehealth: Payer: Self-pay

## 2012-09-28 NOTE — Telephone Encounter (Signed)
Phone call to pt to let him know we received a fax from [facility] regarding a prior authorization on Diltiazem cap 360 mg/24. Per Dr Debby Bud patient has not tried and failed all the tier 1 or 2 formulary drugs. Dr Debby Bud is recommending Amlodipine. Pt states he has enough of the Diltiazem until his OV in July and at that time will discuss starting Amlodipine which will cost him less than what he is currently on.

## 2012-12-28 ENCOUNTER — Ambulatory Visit (INDEPENDENT_AMBULATORY_CARE_PROVIDER_SITE_OTHER): Payer: Medicare Other | Admitting: Internal Medicine

## 2012-12-28 ENCOUNTER — Other Ambulatory Visit (INDEPENDENT_AMBULATORY_CARE_PROVIDER_SITE_OTHER): Payer: Medicare Other

## 2012-12-28 ENCOUNTER — Encounter: Payer: Self-pay | Admitting: Internal Medicine

## 2012-12-28 VITALS — BP 156/90 | HR 63 | Temp 97.7°F | Resp 12 | Ht 72.0 in | Wt 197.0 lb

## 2012-12-28 DIAGNOSIS — R899 Unspecified abnormal finding in specimens from other organs, systems and tissues: Secondary | ICD-10-CM

## 2012-12-28 DIAGNOSIS — F411 Generalized anxiety disorder: Secondary | ICD-10-CM

## 2012-12-28 DIAGNOSIS — Z125 Encounter for screening for malignant neoplasm of prostate: Secondary | ICD-10-CM

## 2012-12-28 DIAGNOSIS — R6889 Other general symptoms and signs: Secondary | ICD-10-CM

## 2012-12-28 DIAGNOSIS — E785 Hyperlipidemia, unspecified: Secondary | ICD-10-CM

## 2012-12-28 DIAGNOSIS — Z0000 Encounter for general adult medical examination without abnormal findings: Secondary | ICD-10-CM

## 2012-12-28 DIAGNOSIS — Z23 Encounter for immunization: Secondary | ICD-10-CM

## 2012-12-28 DIAGNOSIS — IMO0002 Reserved for concepts with insufficient information to code with codable children: Secondary | ICD-10-CM

## 2012-12-28 DIAGNOSIS — M109 Gout, unspecified: Secondary | ICD-10-CM

## 2012-12-28 DIAGNOSIS — M1710 Unilateral primary osteoarthritis, unspecified knee: Secondary | ICD-10-CM

## 2012-12-28 DIAGNOSIS — I10 Essential (primary) hypertension: Secondary | ICD-10-CM

## 2012-12-28 LAB — HEPATIC FUNCTION PANEL
ALT: 19 U/L (ref 0–53)
Bilirubin, Direct: 0.2 mg/dL (ref 0.0–0.3)
Total Bilirubin: 0.8 mg/dL (ref 0.3–1.2)

## 2012-12-28 LAB — COMPREHENSIVE METABOLIC PANEL
Albumin: 4.5 g/dL (ref 3.5–5.2)
Alkaline Phosphatase: 114 U/L (ref 39–117)
BUN: 8 mg/dL (ref 6–23)
CO2: 32 mEq/L (ref 19–32)
Calcium: 10.2 mg/dL (ref 8.4–10.5)
Chloride: 103 mEq/L (ref 96–112)
GFR: 113 mL/min (ref >60.00)
Glucose, Bld: 116 mg/dL — ABNORMAL HIGH (ref 70–99)
Potassium: 3.7 mEq/L (ref 3.5–5.1)

## 2012-12-28 LAB — LIPID PANEL
Cholesterol: 170 mg/dL (ref 0–200)
LDL Cholesterol: 73 mg/dL (ref 0–99)

## 2012-12-28 LAB — HEMOGLOBIN A1C: Hgb A1c MFr Bld: 5.1 % (ref 4.6–6.5)

## 2012-12-28 LAB — PSA: PSA: 1.49 ng/mL (ref 0.10–4.00)

## 2012-12-28 MED ORDER — SIMVASTATIN 20 MG PO TABS: 20.0000 mg | ORAL_TABLET | Freq: Every day | ORAL | Status: AC

## 2012-12-28 MED ORDER — ALLOPURINOL 300 MG PO TABS: 300.0000 mg | ORAL_TABLET | Freq: Every day | ORAL | Status: AC

## 2012-12-28 MED ORDER — FUROSEMIDE 40 MG PO TABS: 40.0000 mg | ORAL_TABLET | Freq: Every day | ORAL | Status: DC

## 2012-12-28 MED ORDER — AMLODIPINE BESYLATE 10 MG PO TABS: 10.0000 mg | ORAL_TABLET | Freq: Every day | ORAL | Status: DC

## 2012-12-28 MED ORDER — CO Q-10 200 MG PO CAPS: 200.0000 mg | ORAL_CAPSULE | Freq: Every day | ORAL | Status: DC

## 2012-12-28 MED ORDER — ACYCLOVIR 400 MG PO TABS: 400.0000 mg | ORAL_TABLET | Freq: Two times a day (BID) | ORAL | Status: AC

## 2012-12-28 MED ORDER — ALPRAZOLAM 0.5 MG PO TABS: 0.5000 mg | ORAL_TABLET | Freq: Three times a day (TID) | ORAL | Status: AC | PRN

## 2012-12-28 MED ORDER — FLUTICASONE PROPIONATE 50 MCG/ACT NA SUSP: 2.0000 | NASAL | Status: AC

## 2012-12-28 MED ORDER — BENAZEPRIL HCL 40 MG PO TABS: 40.0000 mg | ORAL_TABLET | Freq: Every day | ORAL | Status: AC

## 2012-12-28 MED ORDER — DILTIAZEM HCL ER BEADS 360 MG PO CP24: 360.0000 mg | ORAL_CAPSULE | Freq: Every day | ORAL | Status: DC

## 2012-12-28 MED ORDER — NAPROXEN 500 MG PO TABS: 500.0000 mg | ORAL_TABLET | Freq: Two times a day (BID) | ORAL | Status: DC

## 2012-12-28 MED ORDER — TRAMADOL HCL 50 MG PO TABS: ORAL_TABLET | ORAL | Status: DC

## 2012-12-28 NOTE — Patient Instructions (Addendum)
Good to see you.  Exam is normal and labs are order - results will be available on MyChart  See you next year.

## 2012-12-28 NOTE — Progress Notes (Signed)
Subjective:    Patient ID: Shaun Carey, male    DOB: 01-28-1941, 72 y.o.   MRN: [id number]  HPI The patient is here for annual Medicare wellness examination and management of other chronic and acute problems.  BP control - insurance issues in regard to Diltiazem XR. Will try Norvasc. Discussed tramadol and insurance coverage - it is covered.   The risk factors are reflected in the social history.  The roster of all physicians providing medical care to patient - is listed in the Snapshot section of the chart.  Activities of daily living:  The patient is 100% inedpendent in all ADLs: dressing, toileting, feeding as well as independent mobility  Home safety : The patient has smoke detectors in the home. Falls - none, home is fall safe. They wear seatbelts.  firearms are present in the home, kept in a safe fashion). There is no violence in the home.   There is no risks for hepatitis, STDs or HIV. There is no history of blood transfusion. They have no travel history to infectious disease endemic areas of the world.  The patient has not seen their dentist in the last six month. They have seen their eye doctor in the last year - saw Dr. Dione Booze in December - normal exam. They deny any hearing difficulty and have not had audiologic testing in the last year.    They do not  have excessive sun exposure. Discussed the need for sun protection: hats, long sleeves and use of sunscreen if there is significant sun exposure.   Diet: the importance of a healthy diet is discussed. They do have a healthy diet.  The patient has a regular exercise program: walking ,  30 min duration, 2 per week.  The benefits of regular aerobic exercise were discussed. Has "silver sneakers"  Depression screen: there are no signs or vegative symptoms of depression- irritability, change in appetite, anhedonia, sadness/tearfullness.  Cognitive assessment: the patient manages all their financial and personal affairs and is  actively engaged.   The following portions of the patient's history were reviewed and updated as appropriate: allergies, current medications, past family history, past medical history,  past surgical history, past social history  and problem list.  Vision, hearing, body mass index were assessed and reviewed.   During the course of the visit the patient was educated and counseled about appropriate screening and preventive services including : fall prevention , diabetes screening, nutrition counseling, colorectal cancer screening, and recommended immunizations.  Past Medical History  Diagnosis Date  . Hypertension   . Hyperlipidemia   . Anxiety   . DJD (degenerative joint disease) of knee   . Gout   . HSV-1 (herpes simplex virus 1) infection    Past Surgical History  Procedure Laterality Date  . Vasectomy  '86  . Tonsillectomy and adenoidectomy     Family History  Problem Relation Age of Onset  . Alzheimer's disease Mother   . Glaucoma Mother   . Cancer Neg Hx   . Diabetes Neg Hx   . Heart disease Brother     murmur   History   Social History  . Marital Status: Divorced    Spouse Name: N/A    Number of Children: 2  . Years of Education: 18   Occupational History  . retail pharmacist     retired   Social History Main Topics  . Smoking status: Former Smoker    Quit date: 12/28/1962  . Smokeless tobacco: Never Used  .  Alcohol Use: 1.0 oz/week    2 drink(s) per week  . Drug Use: No  . Sexually Active: No   Other Topics Concern  . Not on file   Social History Narrative   [facility], married '65- 13 divorced;married '80-8 yrs widowed;married '95 -21 mos divorced. 1 daughter '70; 1 son '72. HCPOA - dtr Dyan Creelman (c) [phone]. ACD - Yes - CPR, does wish Mechanical ventilation if needed, does want prolonged artificial hydration and nutrition if needed. If he is in a vegative state he would want withdrawal of all life support.      Current Outpatient Prescriptions on File Prior to Visit  Medication Sig Dispense Refill  . acyclovir (ZOVIRAX) 400 MG tablet TAKE ONE TABLET BY MOUTH TWICE DAILY  180 tablet  3  . allopurinol (ZYLOPRIM) 300 MG tablet Take 1 tablet (300 mg total) by mouth daily.  90 tablet  3  . ALPRAZolam (XANAX) 0.5 MG tablet Take 1 tablet (0.5 mg total) by mouth 3 (three) times daily as needed for sleep.  270 tablet  3  . aspirin 81 MG tablet Take 1 tablet (81 mg total) by mouth daily.  90 tablet  3  . benazepril (LOTENSIN) 40 MG tablet TAKE 1 TABLET (40 MG TOTAL) BY MOUTH DAILY.  90 tablet  0  . Cholecalciferol (VITAMIN D-3 PO) Take 1 capsule by mouth daily.      . Coenzyme Q10 (CO Q-10) 200 MG CAPS Take 200 mg by mouth daily.  90 each  3  . diltiazem (TIAZAC) 360 MG 24 hr capsule Take 1 capsule (360 mg total) by mouth daily.  90 capsule  3  . fexofenadine (ALLEGRA) 60 MG tablet Take 3 tablets (180 mg total) by mouth daily.  90 tablet  3  . fluticasone (FLONASE) 50 MCG/ACT nasal spray Place 2 sprays into the nose as directed.  16 g  3  . furosemide (LASIX) 40 MG tablet Take 1 tablet (40 mg total) by mouth daily.  90 tablet  3  . Multiple Vitamin (MULTIVITAMIN) tablet Take 1 tablet by mouth daily.  90 tablet  3  . naproxen (NAPROSYN) 500 MG tablet TAKE ONE TABLET BY MOUTH TWICE DAILY WITH MEALS  180 tablet  1  . NON FORMULARY Osteo bioflex        . OMEGA-3 KRILL OIL PO Take 1 capsule by mouth daily.      . ranitidine (ZANTAC) 150 MG tablet Take 1 tablet (150 mg total) by mouth 2 (two) times daily.  180 tablet  3  . simvastatin (ZOCOR) 20 MG tablet TAKE 1 TABLET BY MOUTH EVERY EVENING  90 tablet  1  . traMADol (ULTRAM) 50 MG tablet TAKE 1 TO 2 TABLETS BY MOUTH EVERY 6 HOURS AS NEEDED FOR ARTHRITIC PAIN.  180 tablet  2  . vitamin E 400 UNIT capsule Take 400 Units by mouth daily.        . Vitamins-Lipotropics (B-100) TABS Take by mouth daily.         No current facility-administered medications on file  prior to visit.     Review of Systems Constitutional:  Negative for fever, chills, activity change and unexpected weight change.  HEENT:  Negative for hearing loss, ear pain, congestion, neck stiffness and postnasal drip. Negative for sore throat or swallowing problems. Negative for dental complaints.   Eyes: Negative for vision loss or change in visual acuity.  Respiratory: Negative for chest tightness and wheezing.  Negative for DOE.   Cardiovascular: Negative for chest pain or palpitations. No decreased exercise tolerance Gastrointestinal: No change in bowel habit. No bloating or gas. No reflux or indigestion Genitourinary: Negative for urgency, frequency, flank pain and difficulty urinating.  Musculoskeletal: Negative for myalgias, back pain, gait problem. h/o knee pain but better with sensible activity and with NSAID/tramadol. Neurological: Negative for dizziness, tremors, weakness and headaches.  Hematological: Negative for adenopathy.  Psychiatric/Behavioral: Negative for behavioral problems and dysphoric mood.       Objective:   Physical Exam Filed Vitals:   12/28/12 0944  BP: 156/90  Pulse: 63  Temp: 97.7 F (36.5 C)  Resp: 12   Wt Readings from Last 3 Encounters:  12/28/12 197 lb (89.359 kg)  02/14/12 208 lb (94.348 kg)  12/28/11 211 lb (95.709 kg)   Gen'l: Well nourished well developed white male in no acute distress  HEENT: Head: Normocephalic and atraumatic. Right Ear: External ear normal. EAC/TM nl. Left Ear: External ear normal.  EAC/TM nl. Nose: Nose normal. Mouth/Throat: Oropharynx is clear and moist. Dentition - native, in good repair. No buccal or palatal lesions. Posterior pharynx clear. Eyes: Conjunctivae and sclera clear. EOM intact. Pupils are equal, round, and reactive to light. Right eye exhibits no discharge. Left eye exhibits no discharge. Neck: Normal range of motion. Neck supple. No JVD present. No tracheal deviation present. No thyromegaly present.   Cardiovascular: Normal rate, regular rhythm, no gallop, no friction rub, no murmur heard.      Quiet precordium. 2+ radial and DP pulses . No carotid bruits Pulmonary/Chest: Effort normal. No respiratory distress or increased WOB, no wheezes, no rales. No chest wall deformity or CVAT. Abdomen: Soft. Bowel sounds are normal in all quadrants. He exhibits no distension, no tenderness, no rebound or guarding, No heptosplenomegaly  Genitourinary:  deferred to PSA Musculoskeletal: Normal range of motion. He exhibits no edema and no tenderness.       Small and large joints without redness, synovial thickening or deformity. Full range of motion preserved about all small, median and large joints.  Lymphadenopathy:    He has no cervical or supraclavicular adenopathy.  Neurological: He is alert and oriented to person, place, and time. CN II-XII intact. DTRs 2+ and symmetrical biceps, radial and patellar tendons. Cerebellar function normal with no tremor, rigidity, normal gait and station.  Skin: Skin is warm and dry. No rash noted. No erythema.  Psychiatric: He has a normal mood and affect. His behavior is normal. Thought content normal.   Recent Results (from the past 2160 hour(s))  LIPID PANEL     Status: None   Collection Time    12/28/12 10:40 AM      Result Value Range   Cholesterol 170  0 - 200 mg/dL   Comment: ATP III Classification       Desirable:  < 200 mg/dL               Borderline High:  200 - 239 mg/dL          High:  > = 161 mg/dL   Triglycerides 09.6  0.0 - 149.0 mg/dL   Comment: Normal:  <045 mg/dLBorderline High:  150 - 199 mg/dL   HDL 40.98  >11.91 mg/dL   VLDL 47.8  0.0 - 29.5 mg/dL   LDL Cholesterol 73  0 - 99 mg/dL   Total CHOL/HDL Ratio 2     Comment:  Men          Women1/2 Average Risk     3.4          3.3Average Risk          5.0          4.42X Average Risk          9.6          7.13X Average Risk          15.0          11.0                      HEMOGLOBIN A1C      Status: None   Collection Time    12/28/12 10:40 AM      Result Value Range   Hemoglobin A1C 5.1  4.6 - 6.5 %   Comment: Glycemic Control Guidelines for People with Diabetes:Non Diabetic:  <6%Goal of Therapy: <7%Additional Action Suggested:  >8%   HEPATIC FUNCTION PANEL     Status: None   Collection Time    12/28/12 10:40 AM      Result Value Range   Total Bilirubin 0.8  0.3 - 1.2 mg/dL   Bilirubin, Direct 0.2  0.0 - 0.3 mg/dL   Alkaline Phosphatase 114  39 - 117 U/L   AST 32  0 - 37 U/L   ALT 19  0 - 53 U/L   Total Protein 7.4  6.0 - 8.3 g/dL   Albumin 4.5  3.5 - 5.2 g/dL  COMPREHENSIVE METABOLIC PANEL     Status: Abnormal   Collection Time    12/28/12 10:40 AM      Result Value Range   Sodium 142  135 - 145 mEq/L   Potassium 3.7  3.5 - 5.1 mEq/L   Chloride 103  96 - 112 mEq/L   CO2 32  19 - 32 mEq/L   Glucose, Bld 116 (*) 70 - 99 mg/dL   BUN 8  6 - 23 mg/dL   Creatinine, Ser 0.7  0.4 - 1.5 mg/dL   Total Bilirubin 0.8  0.3 - 1.2 mg/dL   Alkaline Phosphatase 114  39 - 117 U/L   AST 32  0 - 37 U/L   ALT 19  0 - 53 U/L   Total Protein 7.4  6.0 - 8.3 g/dL   Albumin 4.5  3.5 - 5.2 g/dL   Calcium 78.2  8.4 - 95.6 mg/dL   GFR 213.08  >65.78 mL/min  PSA     Status: None   Collection Time    12/28/12 10:40 AM      Result Value Range   PSA 1.49  0.10 - 4.00 ng/mL         Assessment & Plan:

## 2012-12-30 NOTE — Assessment & Plan Note (Signed)
Interval history negative for any major illness, surgery or injury. Physical exam is normal. Labs reviewed - in normal range, including a normal A1c. He is current with colorectal cancer screening.Discussed pros and cons of prostate cancer screening (USPHCTF recommendations reviewed and ACU April '13 recommendations) and he requests evaluation at this time - PSA is normal. Immunizations are up to date.  In summary - man approaching 72 who appears to be medically stable. Will need to follow up on his blood pressure with medication change. He will need routine mid-year follow up in 6 months and full exam in a year.

## 2012-12-30 NOTE — Assessment & Plan Note (Signed)
Life-style changes, i.e. Not boating, have reduced the strain on his knees and as a consequence he is having less trouble.  Plan - no change in regimen.

## 2012-12-30 NOTE — Assessment & Plan Note (Addendum)
No recent flares. Last uric acid level in July'13 = 3.1  Plan Continue allopurinol.

## 2012-12-30 NOTE — Assessment & Plan Note (Signed)
Concerned about need to change CCB due to insurance formulary.  BP Readings from Last 3 Encounters:  12/28/12 156/90  02/14/12 162/88  12/28/11 132/78   Control has been borderline.  Plan Start amlodipine, continue other meds  BP follow up.

## 2012-12-30 NOTE — Assessment & Plan Note (Signed)
Taking and tolerating "statin" therapy. LDL is much better than goal of 130 or less and HDL is much better than goal of 40+. Liver functions are normal.  Plan No change in regimen.

## 2012-12-30 NOTE — Assessment & Plan Note (Signed)
He reports that he is doing well. Continues to use alprazolam as needed.  Plan  Refill Rx

## 2013-01-05 ENCOUNTER — Telehealth: Payer: Self-pay | Admitting: Internal Medicine

## 2013-01-05 NOTE — Telephone Encounter (Signed)
Patient dismissed from [facility] by Illene Regulus, MD, effective 12/29/2012. Dismissal letter sent out by certified / registered mail. rmf  Received signed domestic return receipt verifying delivery of certified letter on 01/04/2013. Article number [id number] [phone]. rmf

## 2013-03-29 ENCOUNTER — Other Ambulatory Visit: Payer: Self-pay | Admitting: Internal Medicine

## 2013-04-25 ENCOUNTER — Other Ambulatory Visit: Payer: Self-pay | Admitting: Internal Medicine

## 2013-04-26 ENCOUNTER — Other Ambulatory Visit: Payer: Self-pay

## 2014-04-05 ENCOUNTER — Other Ambulatory Visit: Payer: Self-pay

## 2014-12-16 ENCOUNTER — Other Ambulatory Visit: Payer: Self-pay

## 2015-06-13 ENCOUNTER — Ambulatory Visit: Payer: Self-pay | Admitting: Orthopedic Surgery

## 2015-06-13 NOTE — Progress Notes (Signed)
Preoperative surgical orders have been place into the Epic hospital system for Shaun Carey on 06/13/2015, 1:44 PM  by Mickel Crow for surgery on 07-07-15.  Preop Total Knee orders including Experal, IV Tylenol, and IV Decadron as long as there are no contraindications to the above medications. Arlee Muslim, PA-C

## 2015-06-26 ENCOUNTER — Ambulatory Visit: Payer: Self-pay | Admitting: Orthopedic Surgery

## 2015-06-26 NOTE — H&P (Signed)
Shaun Carey DOB: 11-23-1940 Divorced / Language: English / Race: White Male Date of Admission:  07/07/2015 CC:  Left Knee Pain History of Present Illness The patient is a 75 year old male who comes in for a preoperative History and Physical. The patient is scheduled for a left total knee arthroplasty to be performed by Dr. Dione Plover. Aluisio, MD at [facility] on 07-07-2015. The patient is a 75 year old male who presents with knee complaints. The patient was seen in referral from [name], at [facility]. The patient reports left knee and right knee symptoms including: pain, soreness and grinding which began year(s) ago without any known injury.The patient feels that the symptoms are worsening. The patient has the current diagnosis of knee osteoarthritis. Prior to being seen the patient was previously evaluated by a colleague (Dr. [name]) 6 year(s) ago. Previous work-up for this problem has included knee x-rays. Past treatment for this problem has included intra-articular injection of corticosteroids (also had a series of Supartz), nonsteroidal anti-inflammatory drugs (Naproxen) and opioid analgesics (Tramadol). Current treatment includes knee brace. Note for "Knee pain": He just retired a few years ago. He reports that he has had a major change in lifestyle since his retirement. He has cut back on his diet, and has gradually lost about 20 pounds. He reports that he has discussed possibly having surgery with several friends or associates. He has had known arthritis for quite some time now. 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 Over the years and with the standing and twisting on his knees, he just had some increasing pain. He has been told he needed knee replacements in the past, but he has put it off. Over the past four years, he has  lost about 30 pounds and also during his retirement, he has not been as active as he was or stressful on the knees as he was when he was standing, working 12 and 13 hour shifts as a Software engineer. So, the knees have been a little better, but they have had some increasing pain. The left knee seems to be more symptomatic and problematic than the right. The right is not as bad and he does not have as much difficulty with it, but the left knee has been giving some issues. This past June, he was set up for his annual physical and he had the house call RN come by. She recommended that he see Dr. [name] because he was thinking about getting in replacements done. He tells me he has had a history of Supartz injection back in March of 2011. He did well with the bilateral gel shots and this was done by Dr. [name], but he has reached a point now that he would like to get something more permanent done. He did not have any significant pain at rest, but there is some pain at night. It is mostly with weightbearing. He can get in and out of the car pretty well, but going up and down steps and the flexion that tends to give him a little bit more of an issue. He knows he wants to have knee replacement done. He wants to do the left knee first because that has been the more symptomatic and problematic. He has had problems with his knees for many years now. He saw Dr. Oneita Kras back  around 2010, was told he needed knee replacements of those at that time. He had cortisone and viscosupplement injections. He retired and ended up losing a lot of weight, and he said that helped his knees. He is now at a stage, however, where the left knee is bothering him with almost all activities. It is limiting what he can and cannot do. He occasionally will have pain at night. He would like to be more functional and the knee is preventing him from doing so. They have been treated conservatively in the past for the above stated problem and despite conservative  measures, they continue to have progressive pain and severe functional limitations and dysfunction. They have failed non-operative management including home exercise, medications, and injections. It is felt that they would benefit from undergoing total joint replacement. Risks and benefits of the procedure have been discussed with the patient and they elect to proceed with surgery. There are no active contraindications to surgery such as ongoing infection or rapidly progressive neurological disease.  Problem List/Past Medical Primary osteoarthritis of both knees (M17.0)  Gout  High blood pressure  Osteoarthritis  Osteoporosis  Allergies No Known Drug Allergies  Family History Family history unknown - Adopted  First Degree Relatives  reported  Social History Current drinker  01/30/2015: Currently drinks beer only occasionally per week Marital status  divorced No history of drug/alcohol rehab  Not under pain contract  Number of flights of stairs before winded  2-3 Tobacco / smoke exposure  01/30/2015: no Tobacco use  Never smoker. 01/30/2015 Most recent primary occupation  pharmacist; retired Living situation  Lives alone. [place] with Daughter to stay with patient.  Medication History Allopurinol (300MG  Tablet, Oral daily) Active. AmLODIPine Besylate (5MG  Tablet, Oral daily) Active. TraMADol HCl (50MG  Tablet, 1 Oral every 6 hours as needed for pain) Active. ALPRAZolam (0.5MG  Tablet, Oral three times daily) Active. Simvastatin (20MG  Tablet, Oral daily) Active. Acyclovir (400MG  Tablet, Oral two times daily) Active. Benazepril HCl (40MG  Tablet, Oral daily) Active.  Past Surgical History Rotator Cuff Repair  right Tonsillectomy  Vasectomy  Umbilical Hernia Repair  Review of Systems  General Present- Fatigue and Weight Loss (has lost 30 pounds over the past 5 years (planned weight loss)). Not  Present- Chills, Fever, Memory Loss, Night Sweats and Weight Gain. Skin Not Present- Eczema, Hives, Itching, Lesions and Rash. HEENT Not Present- Dentures, Double Vision, Headache, Hearing Loss, Tinnitus and Visual Loss. Respiratory Not Present- Allergies, Chronic Cough, Coughing up blood, Shortness of breath at rest and Shortness of breath with exertion. Cardiovascular Not Present- Chest Pain, Difficulty Breathing Lying Down, Murmur, Palpitations, Racing/skipping heartbeats and Swelling. Gastrointestinal Not Present- Abdominal Pain, Bloody Stool, Constipation, Diarrhea, Difficulty Swallowing, Heartburn, Jaundice, Loss of appetitie, Nausea and Vomiting. Male Genitourinary Not Present- Blood in Urine, Discharge, Flank Pain, Incontinence, Painful Urination, Urgency, Urinary frequency, Urinary Retention, Urinating at Night and Weak urinary stream. Musculoskeletal Present- Back Pain, Joint Pain and Muscle Pain. Not Present- Joint Swelling, Morning Stiffness, Muscle Weakness and Spasms. Neurological Not Present- Blackout spells, Difficulty with balance, Dizziness, Paralysis, Tremor and Weakness. Psychiatric Not Present- Insomnia.  Vitals Weight: 180 lb Height: 71in Weight was reported by patient. Height was reported by patient. Body Surface Area: 2.02 m Body Mass Index: 25.1 kg/m  Pulse: 96 (Regular)  BP: 138/82 (Sitting, Right Arm, Standard)  Physical Exam General Mental Status -Alert, cooperative and good historian. General Appearance-pleasant, Not in acute distress. Orientation-Oriented X3. [place], Well  nourished and Well developed.  Head and Neck Head-normocephalic, atraumatic . Neck Global Assessment - supple, no bruit auscultated on the right, no bruit auscultated on the left.  Eye Vision-Wears corrective lenses. Pupil - Bilateral-Regular and Round. Motion - Bilateral-EOMI.  Chest and Lung Exam Auscultation Breath sounds - clear at  anterior chest wall and clear at posterior chest wall. Adventitious sounds - No Adventitious sounds.  Cardiovascular Auscultation Rhythm - Regular rate and rhythm. Heart Sounds - S1 WNL and S2 WNL. Murmurs & Other Heart Sounds - Auscultation of the heart reveals - No Murmurs.  Abdomen Palpation/Percussion Tenderness - Abdomen is non-tender to palpation. Rigidity (guarding) - Abdomen is soft. Auscultation Auscultation of the abdomen reveals - Bowel sounds normal.  Male Genitourinary Note: Not done, not pertinent to present illness  Musculoskeletal Note: The patient is a 75 year old tall, thin frame, well-nourished, well-developed male, excellent historian. Both knees are examined. Right knee shows a little bit of a contracture about 5 degree with flexion back to 135. The knee is stable with varus and valgus stressing. Essentially nontender on exam today, very minimal if there is malalignment. Moderate crepitus is noted. Left knee, more slight varus malalignment deformity, range of motion about 7 degrees to 120 actively, 125 passively. On varus and valgus stressing, knee is stable. He does open a little bit with valgus stressing, but has a good endpoint. No intraarticular effusions appreciated on either knee.  RADIOGRAPHS X-rays: AP and lateral views of both knees. The right knee, the more asymptomatic knee, does show already bone on bone in the medial compartment, fairly well preserved lateral compartment. The lateral view of the right knee shows narrowing behind the patellofemoral compartment with some posterior femoral spurring and some early posterior patella spurring. Left knee shows already bone on bone in the medial compartment, a little bit more advanced than the right knee. Lateral view of the left knee confirms the bone on bone in the weightbearing surface and also bone on bone and patellofemoral with moderate posterior femoral spurring and more moderate posterior patella spurring with  also some anterior femoral spurring.  Assessment & Plan Primary osteoarthritis of left knee (M17.12)  Note:Surgical Plans: Left Total Knee Replacement  Disposition: Home  PCP: Dr. Antony Blackbird and Eloise Levels, NP - Patient has been seen preoperatively and felt to be stable for surgery.  IV TXA  Anesthesia Issues: None  Signed electronically by Joelene Millin, III PA-C

## 2015-07-01 ENCOUNTER — Encounter (HOSPITAL_COMMUNITY)
Admission: RE | Admit: 2015-07-01 | Discharge: 2015-07-01 | Disposition: A | Discharge status: Home / Self Care | Payer: Medicare Other | Source: Ambulatory Visit | Attending: Orthopedic Surgery | Admitting: Orthopedic Surgery

## 2015-07-01 ENCOUNTER — Encounter (HOSPITAL_COMMUNITY): Payer: Self-pay

## 2015-07-01 DIAGNOSIS — Z01812 Encounter for preprocedural laboratory examination: Secondary | ICD-10-CM | POA: Insufficient documentation

## 2015-07-01 DIAGNOSIS — Z01810 Encounter for preprocedural cardiovascular examination: Secondary | ICD-10-CM | POA: Insufficient documentation

## 2015-07-01 HISTORY — DX: Dorsalgia, unspecified: M54.9

## 2015-07-01 HISTORY — DX: Other fatigue: R53.83

## 2015-07-01 HISTORY — DX: Other abnormalities of gait and mobility: R26.89

## 2015-07-01 HISTORY — DX: Other specified personal risk factors, not elsewhere classified: Z91.89

## 2015-07-01 HISTORY — DX: Personal history of (healed) traumatic fracture: Z87.81

## 2015-07-01 LAB — COMPREHENSIVE METABOLIC PANEL
ALK PHOS: 107 U/L (ref 38–126)
ALT: 15 U/L — AB (ref 17–63)
AST: 26 U/L (ref 15–41)
Albumin: 4.1 g/dL (ref 3.5–5.0)
Anion gap: 7 (ref 5–15)
BILIRUBIN TOTAL: 0.6 mg/dL (ref 0.3–1.2)
BUN: 11 mg/dL (ref 6–20)
CHLORIDE: 104 mmol/L (ref 101–111)
CO2: 30 mmol/L (ref 22–32)
Calcium: 10 mg/dL (ref 8.9–10.3)
Creatinine, Ser: 0.78 mg/dL (ref 0.61–1.24)
GLUCOSE: 104 mg/dL — AB (ref 65–99)
POTASSIUM: 4.4 mmol/L (ref 3.5–5.1)
Sodium: 141 mmol/L (ref 135–145)
Total Protein: 7.5 g/dL (ref 6.5–8.1)

## 2015-07-01 LAB — URINALYSIS, ROUTINE W REFLEX MICROSCOPIC
BILIRUBIN URINE: NEGATIVE
Glucose, UA: NEGATIVE mg/dL
HGB URINE DIPSTICK: NEGATIVE
KETONES UR: NEGATIVE mg/dL
Leukocytes, UA: NEGATIVE
Nitrite: NEGATIVE
Protein, ur: NEGATIVE mg/dL
SPECIFIC GRAVITY, URINE: 1.018 (ref 1.005–1.030)
pH: 7 (ref 5.0–8.0)

## 2015-07-01 LAB — SURGICAL PCR SCREEN
MRSA, PCR: NEGATIVE
STAPHYLOCOCCUS AUREUS: POSITIVE — AB

## 2015-07-01 LAB — TYPE AND SCREEN
ABO/RH(D): O NEG
ANTIBODY SCREEN: NEGATIVE

## 2015-07-01 LAB — CBC
HEMATOCRIT: 40.6 % (ref 39.0–52.0)
HEMOGLOBIN: 13.2 g/dL (ref 13.0–17.0)
MCH: 30.3 pg (ref 26.0–34.0)
MCHC: 32.5 g/dL (ref 30.0–36.0)
MCV: 93.1 fL (ref 78.0–100.0)
PLATELETS: 215 10*3/uL (ref 150–400)
RBC: 4.36 MIL/uL (ref 4.22–5.81)
RDW: 12.7 % (ref 11.5–15.5)
WBC: 9.4 10*3/uL (ref 4.0–10.5)

## 2015-07-01 LAB — ABO/RH: ABO/RH(D): O NEG

## 2015-07-01 LAB — APTT: aPTT: 31 seconds (ref 24–37)

## 2015-07-01 LAB — PROTIME-INR
INR: 1.1 (ref 0.00–1.49)
Prothrombin Time: 14.4 seconds (ref 11.6–15.2)

## 2015-07-01 NOTE — Patient Instructions (Signed)
Shaun Carey  07/01/2015   Your procedure is scheduled on: Monday July 07, 2015   Report to [facility] Main  Entrance take [place]  elevators to 3rd floor to  [place] at 9:30 AM.  Call this number if you have problems the morning of surgery [phone]   Remember: ONLY 1 PERSON MAY GO WITH YOU TO SHORT STAY TO GET  READY MORNING OF [place].  Do not eat food or drink liquids :After Midnight.     Take these medicines the morning of surgery with A SIP OF WATER: Allopurinol; Alprazolam (Xanax) if needed; Amlodipine (Norvasc); Acyclovir                                You may not have any metal on your body including hair pins and              piercings  Do not wear jewelry, lotions, powders or colognes, deodorant                           Men may shave face and neck.   Do not bring valuables to the hospital. [place].  Contacts, dentures or bridgework may not be worn into surgery.  Leave suitcase in the car. After surgery it may be brought to your room.                Please read over the following fact sheets you were given:MRSA INFORMATION SHEET; INCENTIVE SPIROMETER; BLOOD TRANSFUSION INFORMATION SHEET  _____________________________________________________________________             [facility] - Preparing for Surgery Before surgery, you can play an important role.  Because skin is not sterile, your skin needs to be as free of germs as possible.  You can reduce the number of germs on your skin by washing with CHG (chlorahexidine gluconate) soap before surgery.  CHG is an antiseptic cleaner which kills germs and bonds with the skin to continue killing germs even after washing. Please DO NOT use if you have an allergy to CHG or antibacterial soaps.  If your skin becomes reddened/irritated stop using the CHG and inform your nurse when you arrive at Short Stay. Do not shave (including legs and  underarms) for at least 48 hours prior to the first CHG shower.  You may shave your face/neck. Please follow these instructions carefully:  1.  Shower with CHG Soap the night before surgery and the  morning of Surgery.  2.  If you choose to wash your hair, wash your hair first as usual with your  normal  shampoo.  3.  After you shampoo, rinse your hair and body thoroughly to remove the  shampoo.                           4.  Use CHG as you would any other liquid soap.  You can apply chg directly  to the skin and wash                       Gently with a scrungie or clean washcloth.  5.  Apply  the CHG Soap to your body ONLY FROM THE NECK DOWN.   Do not use on face/ open                           Wound or open sores. Avoid contact with eyes, ears mouth and genitals (private parts).                       Wash face,  Genitals (private parts) with your normal soap.             6.  Wash thoroughly, paying special attention to the area where your surgery  will be performed.  7.  Thoroughly rinse your body with warm water from the neck down.  8.  DO NOT shower/wash with your normal soap after using and rinsing off  the CHG Soap.                9.  Pat yourself dry with a clean towel.            10.  Wear clean pajamas.            11.  Place clean sheets on your bed the night of your first shower and do not  sleep with pets. Day of Surgery : Do not apply any lotions/deodorants the morning of surgery.  Please wear clean clothes to the hospital/surgery center.  FAILURE TO FOLLOW THESE INSTRUCTIONS MAY RESULT IN THE CANCELLATION OF YOUR SURGERY PATIENT SIGNATURE_________________________________  NURSE SIGNATURE__________________________________  ________________________________________________________________________   Shaun Carey  An incentive spirometer is a tool that can help keep your lungs clear and active. This tool measures how well you are filling your lungs with each breath. Taking  long deep breaths may help reverse or decrease the chance of developing breathing (pulmonary) problems (especially infection) following:  A long period of time when you are unable to move or be active. BEFORE THE PROCEDURE   If the spirometer includes an indicator to show your best effort, your nurse or respiratory therapist will set it to a desired goal.  If possible, sit up straight or lean slightly forward. Try not to slouch.  Hold the incentive spirometer in an upright position. INSTRUCTIONS FOR USE   Sit on the edge of your bed if possible, or sit up as far as you can in bed or on a chair.  Hold the incentive spirometer in an upright position.  Breathe out normally.  Place the mouthpiece in your mouth and seal your lips tightly around it.  Breathe in slowly and as deeply as possible, raising the piston or the ball toward the top of the column.  Hold your breath for 3-5 seconds or for as long as possible. Allow the piston or ball to fall to the bottom of the column.  Remove the mouthpiece from your mouth and breathe out normally.  Rest for a few seconds and repeat Steps 1 through 7 at least 10 times every 1-2 hours when you are awake. Take your time and take a few normal breaths between deep breaths.  The spirometer may include an indicator to show your best effort. Use the indicator as a goal to work toward during each repetition.  After each set of 10 deep breaths, practice coughing to be sure your lungs are clear. If you have an incision (the cut made at the time of surgery), support your incision when coughing by placing a pillow or rolled  up towels firmly against it. Once you are able to get out of bed, walk around indoors and cough well. You may stop using the incentive spirometer when instructed by your caregiver.  RISKS AND COMPLICATIONS  Take your time so you do not get dizzy or light-headed.  If you are in pain, you may need to take or ask for pain medication before  doing incentive spirometry. It is harder to take a deep breath if you are having pain. AFTER USE  Rest and breathe slowly and easily.  It can be helpful to keep track of a log of your progress. Your caregiver can provide you with a simple table to help with this. If you are using the spirometer at home, follow these instructions: [place] IF:   You are having difficultly using the spirometer.  You have trouble using the spirometer as often as instructed.  Your pain medication is not giving enough relief while using the spirometer.  You develop fever of 100.5 F (38.1 C) or higher. SEEK IMMEDIATE MEDICAL CARE IF:   You cough up bloody sputum that had not been present before.  You develop fever of 102 F (38.9 C) or greater.  You develop worsening pain at or near the incision site. MAKE SURE YOU:   Understand these instructions.  Will watch your condition.  Will get help right away if you are not doing well or get worse. Document Released: 10/18/2006 Document Revised: 08/30/2011 Document Reviewed: 12/19/2006 Exit[employer] Patient Information 2014 [employer], [place].   ________________________________________________________________________  WHAT IS A BLOOD TRANSFUSION? Blood Transfusion Information  A transfusion is the replacement of blood or some of its parts. Blood is made up of multiple cells which provide different functions.  Red blood cells carry oxygen and are used for blood loss replacement.  White blood cells fight against infection.  Platelets control bleeding.  Plasma helps clot blood.  Other blood products are available for specialized needs, such as hemophilia or other clotting disorders. BEFORE THE TRANSFUSION  Who gives blood for transfusions?   Healthy volunteers who are fully evaluated to make sure their blood is safe. This is blood bank blood. Transfusion therapy is the safest it has ever been in the practice of medicine. Before blood is taken  from a donor, a complete history is taken to make sure that person has no history of diseases nor engages in risky social behavior (examples are intravenous drug use or sexual activity with multiple partners). The donor's travel history is screened to minimize risk of transmitting infections, such as malaria. The donated blood is tested for signs of infectious diseases, such as HIV and hepatitis. The blood is then tested to be sure it is compatible with you in order to minimize the chance of a transfusion reaction. If you or a relative donates blood, this is often done in anticipation of surgery and is not appropriate for emergency situations. It takes many days to process the donated blood. RISKS AND COMPLICATIONS Although transfusion therapy is very safe and saves many lives, the main dangers of transfusion include:   Getting an infectious disease.  Developing a transfusion reaction. This is an allergic reaction to something in the blood you were given. Every precaution is taken to prevent this. The decision to have a blood transfusion has been considered carefully by your caregiver before blood is given. Blood is not given unless the benefits outweigh the risks. AFTER THE TRANSFUSION  Right after receiving a blood transfusion, you will usually feel  much better and more energetic. This is especially true if your red blood cells have gotten low (anemic). The transfusion raises the level of the red blood cells which carry oxygen, and this usually causes an energy increase.  The nurse administering the transfusion will monitor you carefully for complications. HOME CARE INSTRUCTIONS  No special instructions are needed after a transfusion. You may find your energy is better. Speak with your caregiver about any limitations on activity for underlying diseases you may have. SEEK MEDICAL CARE IF:   Your condition is not improving after your transfusion.  You develop redness or irritation at the  intravenous (IV) site. SEEK IMMEDIATE MEDICAL CARE IF:  Any of the following symptoms occur over the next 12 hours:  Shaking chills.  You have a temperature by mouth above 102 F (38.9 C), not controlled by medicine.  Chest, back, or muscle pain.  People around you feel you are not acting correctly or are confused.  Shortness of breath or difficulty breathing.  Dizziness and fainting.  You get a rash or develop hives.  You have a decrease in urine output.  Your urine turns a dark color or changes to pink, red, or brown. Any of the following symptoms occur over the next 10 days:  You have a temperature by mouth above 102 F (38.9 C), not controlled by medicine.  Shortness of breath.  Weakness after normal activity.  The white part of the eye turns yellow (jaundice).  You have a decrease in the amount of urine or are urinating less often.  Your urine turns a dark color or changes to pink, red, or brown. Document Released: 06/04/2000 Document Revised: 08/30/2011 Document Reviewed: 01/22/2008 [facility] Patient Information 2014 [place], [place].  _______________________________________________________________________

## 2015-07-01 NOTE — Progress Notes (Signed)
ECHO epic 01/05/2010

## 2015-07-02 NOTE — Progress Notes (Signed)
Surgical screening results in epic per PAT 07/01/2015. Results sent to Dr [name]. Prescription for Mupriocin Ointment called to [facility]; spoke with Ann-pharmacy tech. Pt is aware.

## 2015-07-07 ENCOUNTER — Inpatient Hospital Stay (HOSPITAL_COMMUNITY): Payer: Medicare Other | Admitting: Anesthesiology

## 2015-07-07 ENCOUNTER — Encounter (HOSPITAL_COMMUNITY): Payer: Self-pay | Admitting: *Deleted

## 2015-07-07 ENCOUNTER — Encounter (HOSPITAL_COMMUNITY)
Admission: RE | Disposition: A | Discharge status: Home Health | Payer: Self-pay | Source: Ambulatory Visit | Attending: Orthopedic Surgery

## 2015-07-07 ENCOUNTER — Inpatient Hospital Stay (HOSPITAL_COMMUNITY)
Admission: RE | Admit: 2015-07-07 | Discharge: 2015-07-10 | DRG: 470 | Disposition: A | Discharge status: Home Health | Payer: Medicare Other | Source: Ambulatory Visit | Attending: Orthopedic Surgery | Admitting: Orthopedic Surgery

## 2015-07-07 DIAGNOSIS — I10 Essential (primary) hypertension: Secondary | ICD-10-CM | POA: Diagnosis present

## 2015-07-07 DIAGNOSIS — E785 Hyperlipidemia, unspecified: Secondary | ICD-10-CM | POA: Diagnosis present

## 2015-07-07 DIAGNOSIS — R238 Other skin changes: Secondary | ICD-10-CM | POA: Diagnosis not present

## 2015-07-07 DIAGNOSIS — D62 Acute posthemorrhagic anemia: Secondary | ICD-10-CM | POA: Diagnosis not present

## 2015-07-07 DIAGNOSIS — F419 Anxiety disorder, unspecified: Secondary | ICD-10-CM | POA: Diagnosis present

## 2015-07-07 DIAGNOSIS — M810 Age-related osteoporosis without current pathological fracture: Secondary | ICD-10-CM | POA: Diagnosis present

## 2015-07-07 DIAGNOSIS — M1710 Unilateral primary osteoarthritis, unspecified knee: Secondary | ICD-10-CM | POA: Diagnosis present

## 2015-07-07 DIAGNOSIS — M1712 Unilateral primary osteoarthritis, left knee: Secondary | ICD-10-CM

## 2015-07-07 DIAGNOSIS — Z01812 Encounter for preprocedural laboratory examination: Secondary | ICD-10-CM | POA: Diagnosis not present

## 2015-07-07 DIAGNOSIS — M170 Bilateral primary osteoarthritis of knee: Secondary | ICD-10-CM | POA: Diagnosis present

## 2015-07-07 DIAGNOSIS — R509 Fever, unspecified: Secondary | ICD-10-CM | POA: Diagnosis not present

## 2015-07-07 DIAGNOSIS — M25562 Pain in left knee: Secondary | ICD-10-CM | POA: Diagnosis present

## 2015-07-07 DIAGNOSIS — M179 Osteoarthritis of knee, unspecified: Secondary | ICD-10-CM | POA: Diagnosis present

## 2015-07-07 HISTORY — PX: TOTAL KNEE ARTHROPLASTY: SHX125

## 2015-07-07 SURGERY — ARTHROPLASTY, KNEE, TOTAL
Anesthesia: Monitor Anesthesia Care | Site: Knee | Laterality: Left

## 2015-07-07 MED ORDER — SIMVASTATIN 20 MG PO TABS
20.0000 mg | ORAL_TABLET | Freq: Every day | ORAL | Status: DC
  Administered 2015-07-07 – 2015-07-09 (×3): 20 mg via ORAL
  Filled 2015-07-07 (×4): qty 1

## 2015-07-07 MED ORDER — CEFAZOLIN SODIUM-DEXTROSE 2-3 GM-% IV SOLR
2.0000 g | Freq: Four times a day (QID) | INTRAVENOUS | Status: AC
  Administered 2015-07-07 – 2015-07-08 (×2): 2 g via INTRAVENOUS
  Filled 2015-07-07 (×2): qty 50

## 2015-07-07 MED ORDER — ONDANSETRON HCL 4 MG/2ML IJ SOLN
INTRAMUSCULAR | Status: DC | PRN
  Administered 2015-07-07: 4 mg via INTRAVENOUS

## 2015-07-07 MED ORDER — PHENOL 1.4 % MT LIQD: 1.0000 | OROMUCOSAL | Status: DC | PRN

## 2015-07-07 MED ORDER — METHOCARBAMOL 1000 MG/10ML IJ SOLN
500.0000 mg | Freq: Four times a day (QID) | INTRAVENOUS | Status: DC | PRN
  Administered 2015-07-07: 500 mg via INTRAVENOUS
  Filled 2015-07-07 (×2): qty 5

## 2015-07-07 MED ORDER — FENTANYL CITRATE (PF) 100 MCG/2ML IJ SOLN
INTRAMUSCULAR | Status: DC | PRN
  Administered 2015-07-07 (×2): 50 ug via INTRAVENOUS

## 2015-07-07 MED ORDER — RIVAROXABAN 10 MG PO TABS
10.0000 mg | ORAL_TABLET | Freq: Every day | ORAL | Status: DC
  Administered 2015-07-08 – 2015-07-10 (×3): 10 mg via ORAL
  Filled 2015-07-07 (×4): qty 1

## 2015-07-07 MED ORDER — PROPOFOL 10 MG/ML IV BOLUS
INTRAVENOUS | Status: AC
  Filled 2015-07-07: qty 20

## 2015-07-07 MED ORDER — DEXAMETHASONE SODIUM PHOSPHATE 10 MG/ML IJ SOLN
10.0000 mg | Freq: Once | INTRAMUSCULAR | Status: AC
  Administered 2015-07-07: 10 mg via INTRAVENOUS

## 2015-07-07 MED ORDER — FLEET ENEMA 7-19 GM/118ML RE ENEM: 1.0000 | ENEMA | Freq: Once | RECTAL | Status: DC | PRN

## 2015-07-07 MED ORDER — TRAMADOL HCL 50 MG PO TABS: 50.0000 mg | ORAL_TABLET | Freq: Four times a day (QID) | ORAL | Status: DC | PRN

## 2015-07-07 MED ORDER — EPHEDRINE SULFATE 50 MG/ML IJ SOLN
INTRAMUSCULAR | Status: AC
  Filled 2015-07-07: qty 1

## 2015-07-07 MED ORDER — BUPIVACAINE HCL 0.25 % IJ SOLN
INTRAMUSCULAR | Status: DC | PRN
  Administered 2015-07-07: 20 mL

## 2015-07-07 MED ORDER — CEFAZOLIN SODIUM-DEXTROSE 2-3 GM-% IV SOLR
2.0000 g | INTRAVENOUS | Status: AC
  Administered 2015-07-07: 2 g via INTRAVENOUS

## 2015-07-07 MED ORDER — CHLORHEXIDINE GLUCONATE 4 % EX LIQD: 60.0000 mL | Freq: Once | CUTANEOUS | Status: DC

## 2015-07-07 MED ORDER — ONDANSETRON HCL 4 MG/2ML IJ SOLN: 4.0000 mg | Freq: Four times a day (QID) | INTRAMUSCULAR | Status: DC | PRN

## 2015-07-07 MED ORDER — METOCLOPRAMIDE HCL 10 MG PO TABS: 5.0000 mg | ORAL_TABLET | Freq: Three times a day (TID) | ORAL | Status: DC | PRN

## 2015-07-07 MED ORDER — ACETAMINOPHEN 10 MG/ML IV SOLN
1000.0000 mg | Freq: Once | INTRAVENOUS | Status: AC
  Administered 2015-07-07: 1000 mg via INTRAVENOUS

## 2015-07-07 MED ORDER — ROCURONIUM BROMIDE 100 MG/10ML IV SOLN
INTRAVENOUS | Status: AC
  Filled 2015-07-07: qty 1

## 2015-07-07 MED ORDER — FENTANYL CITRATE (PF) 100 MCG/2ML IJ SOLN
INTRAMUSCULAR | Status: AC
  Filled 2015-07-07: qty 2

## 2015-07-07 MED ORDER — HYDROMORPHONE HCL 1 MG/ML IJ SOLN
INTRAMUSCULAR | Status: AC
  Filled 2015-07-07: qty 1

## 2015-07-07 MED ORDER — HYDROMORPHONE HCL 2 MG/ML IJ SOLN
INTRAMUSCULAR | Status: AC
  Filled 2015-07-07: qty 1

## 2015-07-07 MED ORDER — SODIUM CHLORIDE 0.9 % IJ SOLN
INTRAMUSCULAR | Status: DC | PRN
  Administered 2015-07-07: 30 mL

## 2015-07-07 MED ORDER — ACETAMINOPHEN 325 MG PO TABS
650.0000 mg | ORAL_TABLET | Freq: Four times a day (QID) | ORAL | Status: DC | PRN
  Administered 2015-07-09 – 2015-07-10 (×3): 650 mg via ORAL
  Filled 2015-07-07 (×3): qty 2

## 2015-07-07 MED ORDER — SODIUM CHLORIDE 0.9 % IV SOLN: INTRAVENOUS | Status: DC

## 2015-07-07 MED ORDER — ACETAMINOPHEN 500 MG PO TABS
1000.0000 mg | ORAL_TABLET | Freq: Four times a day (QID) | ORAL | Status: AC
  Administered 2015-07-07 – 2015-07-08 (×4): 1000 mg via ORAL
  Filled 2015-07-07 (×4): qty 2

## 2015-07-07 MED ORDER — DEXAMETHASONE SODIUM PHOSPHATE 10 MG/ML IJ SOLN
10.0000 mg | Freq: Once | INTRAMUSCULAR | Status: AC
  Administered 2015-07-08: 10 mg via INTRAVENOUS
  Filled 2015-07-07: qty 1

## 2015-07-07 MED ORDER — ACETAMINOPHEN 650 MG RE SUPP: 650.0000 mg | Freq: Four times a day (QID) | RECTAL | Status: DC | PRN

## 2015-07-07 MED ORDER — SUGAMMADEX SODIUM 200 MG/2ML IV SOLN
INTRAVENOUS | Status: AC
  Filled 2015-07-07: qty 2

## 2015-07-07 MED ORDER — AMLODIPINE BESYLATE 10 MG PO TABS
10.0000 mg | ORAL_TABLET | Freq: Every day | ORAL | Status: DC
  Administered 2015-07-08 – 2015-07-10 (×3): 10 mg via ORAL
  Filled 2015-07-07 (×3): qty 1

## 2015-07-07 MED ORDER — MORPHINE SULFATE (PF) 2 MG/ML IV SOLN
1.0000 mg | INTRAVENOUS | Status: DC | PRN
  Administered 2015-07-07 (×2): 1 mg via INTRAVENOUS
  Filled 2015-07-07 (×2): qty 1

## 2015-07-07 MED ORDER — ROCURONIUM BROMIDE 100 MG/10ML IV SOLN
INTRAVENOUS | Status: DC | PRN
  Administered 2015-07-07: 40 mg via INTRAVENOUS

## 2015-07-07 MED ORDER — CEFAZOLIN SODIUM-DEXTROSE 2-3 GM-% IV SOLR
INTRAVENOUS | Status: AC
  Filled 2015-07-07: qty 50

## 2015-07-07 MED ORDER — DEXAMETHASONE SODIUM PHOSPHATE 10 MG/ML IJ SOLN
INTRAMUSCULAR | Status: AC
  Filled 2015-07-07: qty 1

## 2015-07-07 MED ORDER — BUPIVACAINE HCL (PF) 0.25 % IJ SOLN
INTRAMUSCULAR | Status: AC
  Filled 2015-07-07: qty 30

## 2015-07-07 MED ORDER — DIPHENHYDRAMINE HCL 12.5 MG/5ML PO ELIX: 12.5000 mg | ORAL_SOLUTION | ORAL | Status: DC | PRN

## 2015-07-07 MED ORDER — HYDROMORPHONE HCL 1 MG/ML IJ SOLN
INTRAMUSCULAR | Status: DC | PRN
  Administered 2015-07-07 (×2): 1 mg via INTRAVENOUS

## 2015-07-07 MED ORDER — FLUTICASONE PROPIONATE 50 MCG/ACT NA SUSP: 2.0000 | Freq: Every day | NASAL | Status: DC | PRN

## 2015-07-07 MED ORDER — LACTATED RINGERS IV SOLN
INTRAVENOUS | Status: DC
Start: 2015-07-07 — End: 2015-07-07
  Administered 2015-07-07: 14:00:00 via INTRAVENOUS
  Administered 2015-07-07: 1000 mL via INTRAVENOUS
  Administered 2015-07-07: 13:00:00 via INTRAVENOUS

## 2015-07-07 MED ORDER — BUPIVACAINE LIPOSOME 1.3 % IJ SUSP
20.0000 mL | Freq: Once | INTRAMUSCULAR | Status: DC
  Filled 2015-07-07: qty 20

## 2015-07-07 MED ORDER — DOCUSATE SODIUM 100 MG PO CAPS
100.0000 mg | ORAL_CAPSULE | Freq: Two times a day (BID) | ORAL | Status: DC
  Administered 2015-07-07 – 2015-07-10 (×6): 100 mg via ORAL

## 2015-07-07 MED ORDER — METHOCARBAMOL 500 MG PO TABS
500.0000 mg | ORAL_TABLET | Freq: Four times a day (QID) | ORAL | Status: DC | PRN
Start: 2015-07-07 — End: 2015-07-10
  Administered 2015-07-07 – 2015-07-10 (×5): 500 mg via ORAL
  Filled 2015-07-07 (×5): qty 1

## 2015-07-07 MED ORDER — HYDROMORPHONE HCL 1 MG/ML IJ SOLN
0.2500 mg | INTRAMUSCULAR | Status: DC | PRN
  Administered 2015-07-07 (×4): 0.5 mg via INTRAVENOUS

## 2015-07-07 MED ORDER — BUPIVACAINE LIPOSOME 1.3 % IJ SUSP
INTRAMUSCULAR | Status: DC | PRN
  Administered 2015-07-07: 50 mL

## 2015-07-07 MED ORDER — SODIUM CHLORIDE 0.9 % IJ SOLN
INTRAMUSCULAR | Status: AC
  Filled 2015-07-07: qty 50

## 2015-07-07 MED ORDER — BISACODYL 10 MG RE SUPP: 10.0000 mg | Freq: Every day | RECTAL | Status: DC | PRN

## 2015-07-07 MED ORDER — ACYCLOVIR 400 MG PO TABS
400.0000 mg | ORAL_TABLET | Freq: Two times a day (BID) | ORAL | Status: DC
  Administered 2015-07-07 – 2015-07-10 (×6): 400 mg via ORAL
  Filled 2015-07-07 (×7): qty 1

## 2015-07-07 MED ORDER — ONDANSETRON HCL 4 MG PO TABS: 4.0000 mg | ORAL_TABLET | Freq: Four times a day (QID) | ORAL | Status: DC | PRN

## 2015-07-07 MED ORDER — LIDOCAINE HCL (CARDIAC) 20 MG/ML IV SOLN
INTRAVENOUS | Status: DC | PRN
  Administered 2015-07-07: 50 mg via INTRAVENOUS

## 2015-07-07 MED ORDER — METOCLOPRAMIDE HCL 5 MG/ML IJ SOLN: 5.0000 mg | Freq: Three times a day (TID) | INTRAMUSCULAR | Status: DC | PRN

## 2015-07-07 MED ORDER — OXYCODONE HCL 5 MG PO TABS
5.0000 mg | ORAL_TABLET | ORAL | Status: DC | PRN
  Administered 2015-07-07 (×2): 5 mg via ORAL
  Administered 2015-07-08 – 2015-07-10 (×14): 10 mg via ORAL
  Filled 2015-07-07: qty 2
  Filled 2015-07-07 (×2): qty 1
  Filled 2015-07-07 (×13): qty 2

## 2015-07-07 MED ORDER — ALLOPURINOL 300 MG PO TABS
300.0000 mg | ORAL_TABLET | Freq: Every day | ORAL | Status: DC
  Administered 2015-07-08 – 2015-07-10 (×3): 300 mg via ORAL
  Filled 2015-07-07 (×3): qty 1

## 2015-07-07 MED ORDER — SUGAMMADEX SODIUM 500 MG/5ML IV SOLN
INTRAVENOUS | Status: DC | PRN
  Administered 2015-07-07: 180 mg via INTRAVENOUS

## 2015-07-07 MED ORDER — MENTHOL 3 MG MT LOZG: 1.0000 | LOZENGE | OROMUCOSAL | Status: DC | PRN

## 2015-07-07 MED ORDER — ACETAMINOPHEN 10 MG/ML IV SOLN
INTRAVENOUS | Status: AC
  Filled 2015-07-07: qty 100

## 2015-07-07 MED ORDER — SUCCINYLCHOLINE CHLORIDE 20 MG/ML IJ SOLN
INTRAMUSCULAR | Status: DC | PRN
  Administered 2015-07-07: 100 mg via INTRAVENOUS

## 2015-07-07 MED ORDER — POLYETHYLENE GLYCOL 3350 17 G PO PACK: 17.0000 g | PACK | Freq: Every day | ORAL | Status: DC | PRN

## 2015-07-07 MED ORDER — ALPRAZOLAM 0.5 MG PO TABS
0.5000 mg | ORAL_TABLET | Freq: Three times a day (TID) | ORAL | Status: DC | PRN
  Administered 2015-07-08: 0.5 mg via ORAL
  Filled 2015-07-07: qty 1

## 2015-07-07 MED ORDER — LIDOCAINE HCL (CARDIAC) 20 MG/ML IV SOLN
INTRAVENOUS | Status: AC
  Filled 2015-07-07: qty 5

## 2015-07-07 MED ORDER — PROPOFOL 10 MG/ML IV BOLUS
INTRAVENOUS | Status: DC | PRN
  Administered 2015-07-07: 200 mg via INTRAVENOUS

## 2015-07-07 MED ORDER — MIDAZOLAM HCL 2 MG/2ML IJ SOLN
INTRAMUSCULAR | Status: AC
  Filled 2015-07-07: qty 2

## 2015-07-07 MED ORDER — ONDANSETRON HCL 4 MG/2ML IJ SOLN
INTRAMUSCULAR | Status: AC
  Filled 2015-07-07: qty 2

## 2015-07-07 MED ORDER — SODIUM CHLORIDE 0.9 % IV SOLN
INTRAVENOUS | Status: DC
  Administered 2015-07-07: 18:00:00 via INTRAVENOUS

## 2015-07-07 MED ORDER — SODIUM CHLORIDE 0.9 % IV SOLN
1000.0000 mg | INTRAVENOUS | Status: AC
  Administered 2015-07-07: 1000 mg via INTRAVENOUS
  Filled 2015-07-07: qty 10

## 2015-07-07 SURGICAL SUPPLY — 51 items
BAG DECANTER FOR FLEXI CONT (MISCELLANEOUS) ×1 IMPLANT
BAG SPEC THK2 15X12 ZIP CLS (MISCELLANEOUS)
BAG ZIPLOCK 12X15 (MISCELLANEOUS) ×1 IMPLANT
BANDAGE ACE 6X5 VEL STRL LF (GAUZE/BANDAGES/DRESSINGS) ×3 IMPLANT
BLADE SAG 18X100X1.27 (BLADE) ×3 IMPLANT
BLADE SAW SGTL 11.0X1.19X90.0M (BLADE) ×3 IMPLANT
BOWL SMART MIX CTS (DISPOSABLE) ×3 IMPLANT
CAPT KNEE TOTAL 3 ATTUNE ×2 IMPLANT
CEMENT HV SMART SET (Cement) ×6 IMPLANT
CLOSURE WOUND 1/2 X4 (GAUZE/BANDAGES/DRESSINGS) ×2
CLOTH BEACON ORANGE TIMEOUT ST (SAFETY) ×3 IMPLANT
CUFF TOURN SGL QUICK 34 (TOURNIQUET CUFF) ×3
CUFF TRNQT CYL 34X4X40X1 (TOURNIQUET CUFF) ×1 IMPLANT
DECANTER SPIKE VIAL GLASS SM (MISCELLANEOUS) ×3 IMPLANT
DRAPE U-SHAPE 47X51 STRL (DRAPES) ×3 IMPLANT
DRSG ADAPTIC 3X8 NADH LF (GAUZE/BANDAGES/DRESSINGS) ×3 IMPLANT
DRSG PAD ABDOMINAL 8X10 ST (GAUZE/BANDAGES/DRESSINGS) ×3 IMPLANT
DURAPREP 26ML APPLICATOR (WOUND CARE) ×3 IMPLANT
ELECT REM PT RETURN 9FT ADLT (ELECTROSURGICAL) ×3
ELECTRODE REM PT RTRN 9FT ADLT (ELECTROSURGICAL) ×1 IMPLANT
EVACUATOR 1/8 PVC DRAIN (DRAIN) ×3 IMPLANT
GAUZE SPONGE 4X4 12PLY STRL (GAUZE/BANDAGES/DRESSINGS) ×3 IMPLANT
GLOVE BIO SURGEON STRL SZ7.5 (GLOVE) ×12 IMPLANT
GLOVE BIO SURGEON STRL SZ8 (GLOVE) ×3 IMPLANT
GLOVE BIOGEL PI IND STRL 6.5 (GLOVE) IMPLANT
GLOVE BIOGEL PI IND STRL 8 (GLOVE) ×1 IMPLANT
GLOVE BIOGEL PI INDICATOR 6.5 (GLOVE)
GLOVE BIOGEL PI INDICATOR 8 (GLOVE) ×2
GLOVE SURG SS PI 6.5 STRL IVOR (GLOVE) IMPLANT
GOWN STRL REUS W/TWL LRG LVL3 (GOWN DISPOSABLE) ×9 IMPLANT
GOWN STRL REUS W/TWL XL LVL3 (GOWN DISPOSABLE) IMPLANT
HANDPIECE INTERPULSE COAX TIP (DISPOSABLE) ×3
IMMOBILIZER KNEE 20 (SOFTGOODS) ×3
IMMOBILIZER KNEE 20 THIGH 36 (SOFTGOODS) ×1 IMPLANT
MANIFOLD NEPTUNE II (INSTRUMENTS) ×3 IMPLANT
NS IRRIG 1000ML POUR BTL (IV SOLUTION) ×3 IMPLANT
PACK TOTAL KNEE CUSTOM (KITS) ×3 IMPLANT
PADDING CAST COTTON 6X4 STRL (CAST SUPPLIES) ×9 IMPLANT
POSITIONER SURGICAL ARM (MISCELLANEOUS) ×3 IMPLANT
SET HNDPC FAN SPRY TIP SCT (DISPOSABLE) ×1 IMPLANT
STRIP CLOSURE SKIN 1/2X4 (GAUZE/BANDAGES/DRESSINGS) ×4 IMPLANT
SUT MNCRL AB 4-0 PS2 18 (SUTURE) ×3 IMPLANT
SUT VIC AB 2-0 CT1 27 (SUTURE) ×12
SUT VIC AB 2-0 CT1 TAPERPNT 27 (SUTURE) ×3 IMPLANT
SUT VLOC 180 0 24IN GS25 (SUTURE) ×3 IMPLANT
SYR 50ML LL SCALE MARK (SYRINGE) ×3 IMPLANT
TRAY FOLEY W/METER SILVER 14FR (SET/KITS/TRAYS/PACK) ×1 IMPLANT
TRAY FOLEY W/METER SILVER 16FR (SET/KITS/TRAYS/PACK) ×3 IMPLANT
WATER STERILE IRR 1500ML POUR (IV SOLUTION) ×3 IMPLANT
WRAP KNEE MAXI GEL POST OP (GAUZE/BANDAGES/DRESSINGS) ×3 IMPLANT
YANKAUER SUCT BULB TIP 10FT TU (MISCELLANEOUS) ×3 IMPLANT

## 2015-07-07 NOTE — Op Note (Signed)
Pre-operative diagnosis- Osteoarthritis  Left knee(s)  Post-operative diagnosis- Osteoarthritis Left knee(s)  Procedure-  Left  Total Knee Arthroplasty  Surgeon- Shaun Carey. Shaun Swamy, MD  Assistant- Shaun Muslim, PA-C   Anesthesia-  General  EBL-* No blood loss amount entered *   Drains Hemovac  Tourniquet time-  Total Tourniquet Time Documented: Thigh (Left) - 36 minutes Total: Thigh (Left) - 36 minutes     Complications- None  Condition-PACU - hemodynamically stable.   Brief Clinical Note   Shaun Carey is a 75 y.o. year old male with end stage OA of his left knee with progressively worsening pain and dysfunction. He has constant pain, with activity and at rest and significant functional deficits with difficulties even with ADLs. He has had extensive non-op management including analgesics, injections of cortisone, and home exercise program, but remains in significant pain with significant dysfunction. Radiographs show bone on bone arthritis medial and patellofemoral. He presents now for left Total Knee Arthroplasty.     Procedure in detail---   The patient is brought into the operating room and positioned supine on the operating table. After successful administration of  General,   a tourniquet is placed high on the  Left thigh(s) and the lower extremity is prepped and draped in the usual sterile fashion. Time out is performed by the operating team and then the  Left lower extremity is wrapped in [place], knee flexed and the tourniquet inflated to 300 mmHg.       A midline incision is made with a ten blade through the subcutaneous tissue to the level of the extensor mechanism. A fresh blade is used to make a medial parapatellar arthrotomy. Soft tissue over the proximal medial tibia is subperiosteally elevated to the joint line with a knife and into the semimembranosus bursa with a Cobb elevator. Soft tissue over the proximal lateral tibia is elevated with attention being paid to  avoiding the patellar tendon on the tibial tubercle. The patella is everted, knee flexed 90 degrees and the ACL and PCL are removed. Findings are bone on bone medial and patellofemoral with massive global osteophytes        The drill is used to create a starting hole in the distal femur and the canal is thoroughly irrigated with sterile saline to remove the fatty contents. The 5 degree Left  valgus alignment guide is placed into the femoral canal and the distal femoral cutting block is pinned to remove 10 mm off the distal femur. Resection is made with an oscillating saw.      The tibia is subluxed forward and the menisci are removed. The extramedullary alignment guide is placed referencing proximally at the medial aspect of the tibial tubercle and distally along the second metatarsal axis and tibial crest. The block is pinned to remove 101mm off the more deficient medial  side. Resection is made with an oscillating saw. Size 8is the most appropriate size for the tibia and the proximal tibia is prepared with the modular drill and keel punch for that size.      The femoral sizing guide is placed and size 8 is most appropriate. Rotation is marked off the epicondylar axis and confirmed by creating a rectangular flexion gap at 90 degrees. The size 8 cutting block is pinned in this rotation and the anterior, posterior and chamfer cuts are made with the oscillating saw. The intercondylar block is then placed and that cut is made.      Trial size 8 tibial component,  trial size 8 posterior stabilized femur and a 10  mm posterior stabilized rotating platform insert trial is placed. Full extension is achieved with excellent varus/valgus and anterior/posterior balance throughout full range of motion. The patella is everted and thickness measured to be 27  mm. Free hand resection is taken to 15 mm, a 41 template is placed, lug holes are drilled, trial patella is placed, and it tracks normally. Osteophytes are removed off the  posterior femur with the trial in place. All trials are removed and the cut bone surfaces prepared with pulsatile lavage. Cement is mixed and once ready for implantation, the size 8 tibial implant, size  8 posterior stabilized femoral component, and the size 41 patella are cemented in place and the patella is held with the clamp. The trial insert is placed and the knee held in full extension. The Exparel (20 ml mixed with 30 ml saline) and .25% Bupivicaine, are injected into the extensor mechanism, posterior capsule, medial and lateral gutters and subcutaneous tissues.  All extruded cement is removed and once the cement is hard the permanent 10 mm posterior stabilized rotating platform insert is placed into the tibial tray.      The wound is copiously irrigated with saline solution and the extensor mechanism closed over a hemovac drain with #1 V-loc suture. The tourniquet is released for a total tourniquet time of 36  minutes. Flexion against gravity is 140 degrees and the patella tracks normally. Subcutaneous tissue is closed with 2.0 vicryl and subcuticular with running 4.0 Monocryl. The incision is cleaned and dried and steri-strips and a bulky sterile dressing are applied. The limb is placed into a knee immobilizer and the patient is awakened and transported to recovery in stable condition.      Please note that a surgical assistant was a medical necessity for this procedure in order to perform it in a safe and expeditious manner. Surgical assistant was necessary to retract the ligaments and vital neurovascular structures to prevent injury to them and also necessary for proper positioning of the limb to allow for anatomic placement of the prosthesis.   Shaun Carey Shaun Garro, MD    07/07/2015, 1:20 PM

## 2015-07-07 NOTE — Anesthesia Procedure Notes (Signed)
Procedure Name: Intubation Date/Time: 07/07/2015 12:30 PM Performed by: Noralyn Pick D Pre-anesthesia Checklist: Patient identified, Emergency Drugs available, Suction available and Patient being monitored Patient Re-evaluated:Patient Re-evaluated prior to inductionOxygen Delivery Method: Circle System Utilized Preoxygenation: Pre-oxygenation with 100% oxygen Intubation Type: IV induction Ventilation: Mask ventilation without difficulty Laryngoscope Size: Mac and 4 Grade View: Grade II Tube type: Oral Tube size: 7.5 mm Number of attempts: 1 Airway Equipment and Method: Stylet and Oral airway Placement Confirmation: ETT inserted through vocal cords under direct vision,  positive ETCO2 and breath sounds checked- equal and bilateral Secured at: 22 cm Tube secured with: Tape Dental Injury: Teeth and Oropharynx as per pre-operative assessment

## 2015-07-07 NOTE — Progress Notes (Signed)
Utilization review completed.  

## 2015-07-07 NOTE — Interval H&P Note (Signed)
History and Physical Interval Note:  07/07/2015 11:11 AM  Shaun Carey  has presented today for surgery, with the diagnosis of OA OF LEFT KNEE  The various methods of treatment have been discussed with the patient and family. After consideration of risks, benefits and other options for treatment, the patient has consented to  Procedure(s): LEFT TOTAL KNEE ARTHROPLASTY (Left) as a surgical intervention .  The patient's history has been reviewed, patient examined, no change in status, stable for surgery.  I have reviewed the patient's chart and labs.  Questions were answered to the patient's satisfaction.     [name]

## 2015-07-07 NOTE — Transfer of Care (Signed)
Immediate Anesthesia Transfer of Care Note  Patient: Shaun Carey  Procedure(s) Performed: Procedure(s): LEFT TOTAL KNEE ARTHROPLASTY (Left)  Patient Location: PACU  Anesthesia Type:General  Level of Consciousness: awake, alert  and oriented  Airway & Oxygen Therapy: Patient Spontanous Breathing and Patient connected to face mask oxygen  Post-op Assessment: Report given to RN and Post -op Vital signs reviewed and stable  Post vital signs: Reviewed and stable  Last Vitals:  Filed Vitals:   07/07/15 0941  BP: 147/86  Pulse: 96  Temp: 36.8 C  Resp: 16    Complications: No apparent anesthesia complications

## 2015-07-07 NOTE — H&P (View-Only) (Signed)
Shaun Carey DOB: [birth year] Divorced / Language: English / Race: White Male Date of Admission:  07/07/2015 CC:  Left Knee Pain History of Present Illness The patient is a 75 year old male who comes in for a preoperative History and Physical. The patient is scheduled for a left total knee arthroplasty to be performed by Dr. Dione Plover. Aluisio, MD at [facility] on 07-07-2015. The patient is a 75 year old male who presents with knee complaints. The patient was seen in referral from Shaun Carey, at [facility]. The patient reports left knee and right knee symptoms including: pain, soreness and grinding which began year(s) ago without any known injury.The patient feels that the symptoms are worsening. The patient has the current diagnosis of knee osteoarthritis. Prior to being seen the patient was previously evaluated by a colleague (Dr. Alfonso Carey) 6 year(s) ago. Previous work-up for this problem has included knee x-rays. Past treatment for this problem has included intra-articular injection of corticosteroids (also had a series of Supartz), nonsteroidal anti-inflammatory drugs (Naproxen) and opioid analgesics (Tramadol). Current treatment includes knee brace. Note for "Knee pain": He just retired a few years ago. He reports that he has had a major change in lifestyle since his retirement. He has cut back on his diet, and has gradually lost about 20 pounds. He reports that he has discussed possibly having surgery with several friends or associates. He has had known arthritis for quite some time now. 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 Over the years and with the standing and twisting on his knees, he just had some increasing pain. He has been told he needed knee replacements in the past, but he has put it off. Over the past four years, he has  lost about 30 pounds and also during his retirement, he has not been as active as he was or stressful on the knees as he was when he was standing, working 12 and 13 hour shifts as a Software engineer. So, the knees have been a little better, but they have had some increasing pain. The left knee seems to be more symptomatic and problematic than the right. The right is not as bad and he does not have as much difficulty with it, but the left knee has been giving some issues. This past June, he was set up for his annual physical and he had the house call RN come by. She recommended that he see Dr. Wynelle Carey because he was thinking about getting in replacements done. He tells me he has had a history of Supartz injection back in March of 2011. He did well with the bilateral gel shots and this was done by Dr. Alfonso Carey, but he has reached a point now that he would like to get something more permanent done. He did not have any significant pain at rest, but there is some pain at night. It is mostly with weightbearing. He can get in and out of the car pretty well, but going up and down steps and the flexion that tends to give him a little bit more of an issue. He knows he wants to have knee replacement done. He wants to do the left knee first because that has been the more symptomatic and problematic. He has had problems with his knees for many years now. He saw Dr. Oneita Carey back  around 2010, was told he needed knee replacements of those at that time. He had cortisone and viscosupplement injections. He retired and ended up losing a lot of weight, and he said that helped his knees. He is now at a stage, however, where the left knee is bothering him with almost all activities. It is limiting what he can and cannot do. He occasionally will have pain at night. He would like to be more functional and the knee is preventing him from doing so. They have been treated conservatively in the past for the above stated problem and despite conservative  measures, they continue to have progressive pain and severe functional limitations and dysfunction. They have failed non-operative management including home exercise, medications, and injections. It is felt that they would benefit from undergoing total joint replacement. Risks and benefits of the procedure have been discussed with the patient and they elect to proceed with surgery. There are no active contraindications to surgery such as ongoing infection or rapidly progressive neurological disease.  Problem List/Past Medical Primary osteoarthritis of both knees (M17.0)  Gout  High blood pressure  Osteoarthritis  Osteoporosis  Allergies No Known Drug Allergies  Family History Family history unknown - Adopted  First Degree Relatives  reported  Social History Current drinker  01/30/2015: Currently drinks beer only occasionally per week Marital status  divorced No history of drug/alcohol rehab  Not under pain contract  Number of flights of stairs before winded  2-3 Tobacco / smoke exposure  01/30/2015: no Tobacco use  Never smoker. 01/30/2015 Most recent primary occupation  pharmacist; retired Living situation  Lives alone[place] with Daughter to stay with patient.  Medication History Allopurinol (300MG  Tablet, Oral daily) Active. AmLODIPine Besylate (5MG  Tablet, Oral daily) Active. TraMADol HCl (50MG  Tablet, 1 Oral every 6 hours as needed for pain) Active. ALPRAZolam (0.5MG  Tablet, Oral three times daily) Active. Simvastatin (20MG  Tablet, Oral daily) Active. Acyclovir (400MG  Tablet, Oral two times daily) Active. Benazepril HCl (40MG  Tablet, Oral daily) Active.  Past Surgical History Rotator Cuff Repair  right Tonsillectomy  Vasectomy  Umbilical Hernia Repair  Review of Systems  General Present- Fatigue and Weight Loss (has lost 30 pounds over the past 5 years (planned weight loss)). Not  Present- Chills, Fever, Memory Loss, Night Sweats and Weight Gain. Skin Not Present- Eczema, Hives, Itching, Lesions and Rash. HEENT Not Present- Dentures, Double Vision, Headache, Hearing Loss, Tinnitus and Visual Loss. Respiratory Not Present- Allergies, Chronic Cough, Coughing up blood, Shortness of breath at rest and Shortness of breath with exertion. Cardiovascular Not Present- Chest Pain, Difficulty Breathing Lying Down, Murmur, Palpitations, Racing/skipping heartbeats and Swelling. Gastrointestinal Not Present- Abdominal Pain, Bloody Stool, Constipation, Diarrhea, Difficulty Swallowing, Heartburn, Jaundice, Loss of appetitie, Nausea and Vomiting. Male Genitourinary Not Present- Blood in Urine, Discharge, Flank Pain, Incontinence, Painful Urination, Urgency, Urinary frequency, Urinary Retention, Urinating at Night and Weak urinary stream. Musculoskeletal Present- Back Pain, Joint Pain and Muscle Pain. Not Present- Joint Swelling, Morning Stiffness, Muscle Weakness and Spasms. Neurological Not Present- Blackout spells, Difficulty with balance, Dizziness, Paralysis, Tremor and Weakness. Psychiatric Not Present- Insomnia.  Vitals Weight: 180 lb Height: 71in Weight was reported by patient. Height was reported by patient. Body Surface Area: 2.02 m Body Mass Index: 25.1 kg/m  Pulse: 96 (Regular)  BP: 138/82 (Sitting, Right Arm, Standard)  Physical Exam General Mental Status -Alert, cooperative and good historian. General Appearance-pleasant, Not in acute distress. Orientation-Oriented X3. [place], Well  nourished and Well developed.  Head and Neck Head-normocephalic, atraumatic . Neck Global Assessment - supple, no bruit auscultated on the right, no bruit auscultated on the left.  Eye Vision-Wears corrective lenses. Pupil - Bilateral-Regular and Round. Motion - Bilateral-EOMI.  Chest and Lung Exam Auscultation Breath sounds - clear at  anterior chest wall and clear at posterior chest wall. Adventitious sounds - No Adventitious sounds.  Cardiovascular Auscultation Rhythm - Regular rate and rhythm. Heart Sounds - S1 WNL and S2 WNL. Murmurs & Other Heart Sounds - Auscultation of the heart reveals - No Murmurs.  Abdomen Palpation/Percussion Tenderness - Abdomen is non-tender to palpation. Rigidity (guarding) - Abdomen is soft. Auscultation Auscultation of the abdomen reveals - Bowel sounds normal.  Male Genitourinary Note: Not done, not pertinent to present illness  Musculoskeletal Note: The patient is a 75 year old tall, thin frame, well-nourished, well-developed male, excellent historian. Both knees are examined. Right knee shows a little bit of a contracture about 5 degree with flexion back to 135. The knee is stable with varus and valgus stressing. Essentially nontender on exam today, very minimal if there is malalignment. Moderate crepitus is noted. Left knee, more slight varus malalignment deformity, range of motion about 7 degrees to 120 actively, 125 passively. On varus and valgus stressing, knee is stable. He does open a little bit with valgus stressing, but has a good endpoint. No intraarticular effusions appreciated on either knee.  RADIOGRAPHS X-rays: AP and lateral views of both knees. The right knee, the more asymptomatic knee, does show already bone on bone in the medial compartment, fairly well preserved lateral compartment. The lateral view of the right knee shows narrowing behind the patellofemoral compartment with some posterior femoral spurring and some early posterior patella spurring. Left knee shows already bone on bone in the medial compartment, a little bit more advanced than the right knee. Lateral view of the left knee confirms the bone on bone in the weightbearing surface and also bone on bone and patellofemoral with moderate posterior femoral spurring and more moderate posterior patella spurring with  also some anterior femoral spurring.  Assessment & Plan Primary osteoarthritis of left knee (M17.12)  Note:Surgical Plans: Left Total Knee Replacement  Disposition: Home  PCP: Dr. Antony Blackbird and Shaun Levels, Carey - Patient has been seen preoperatively and felt to be stable for surgery.  IV TXA  Anesthesia Issues: None  Signed electronically by Joelene Millin, III PA-C

## 2015-07-07 NOTE — Anesthesia Preprocedure Evaluation (Addendum)
Anesthesia Evaluation  Patient identified by MRN, date of birth, ID band Patient awake    Reviewed: Allergy & Precautions, H&P , NPO status , Patient's Chart, lab work & pertinent test results  Airway Mallampati: III  TM Distance: >3 FB Neck ROM: Full    Dental no notable dental hx. (+) Teeth Intact, Dental Advisory Given   Pulmonary neg pulmonary ROS,    Pulmonary exam normal breath sounds clear to auscultation       Cardiovascular hypertension, Pt. on medications  Rhythm:Regular Rate:Normal     Neuro/Psych  Headaches, Anxiety    GI/Hepatic negative GI ROS, Neg liver ROS,   Endo/Other  negative endocrine ROS  Renal/GU negative Renal ROS  negative genitourinary   Musculoskeletal  (+) Arthritis , Osteoarthritis,    Abdominal   Peds  Hematology negative hematology ROS (+)   Anesthesia Other Findings   Reproductive/Obstetrics negative OB ROS                            Anesthesia Physical Anesthesia Plan  ASA: II  Anesthesia Plan: MAC and Spinal   Post-op Pain Management:    Induction: Intravenous  Airway Management Planned: Simple Face Mask  Additional Equipment:   Intra-op Plan:   Post-operative Plan:   Informed Consent: I have reviewed the patients History and Physical, chart, labs and discussed the procedure including the risks, benefits and alternatives for the proposed anesthesia with the patient or authorized representative who has indicated his/her understanding and acceptance.   Dental advisory given  Plan Discussed with: CRNA  Anesthesia Plan Comments:         Anesthesia Quick Evaluation

## 2015-07-07 NOTE — Anesthesia Postprocedure Evaluation (Signed)
Anesthesia Post Note  Patient: Shaun Carey  Procedure(s) Performed: Procedure(s) (LRB): LEFT TOTAL KNEE ARTHROPLASTY (Left)  Patient location during evaluation: PACU Anesthesia Type: General Level of consciousness: awake and alert Pain management: pain level controlled Vital Signs Assessment: post-procedure vital signs reviewed and stable Respiratory status: spontaneous breathing, nonlabored ventilation, respiratory function stable and patient connected to nasal cannula oxygen Cardiovascular status: blood pressure returned to baseline and stable Postop Assessment: no signs of nausea or vomiting Anesthetic complications: no    Last Vitals:  Filed Vitals:   07/07/15 1445 07/07/15 1500  BP: 148/83 144/71  Pulse: 70 84  Temp: 36.4 C   Resp: 15 18    Last Pain:  Filed Vitals:   07/07/15 1500  PainSc: Asleep                 Calayah Guadarrama,W. EDMOND

## 2015-07-08 LAB — CBC
HEMATOCRIT: 30.6 % — AB (ref 39.0–52.0)
HEMOGLOBIN: 10.1 g/dL — AB (ref 13.0–17.0)
MCH: 30.3 pg (ref 26.0–34.0)
MCHC: 33 g/dL (ref 30.0–36.0)
MCV: 91.9 fL (ref 78.0–100.0)
Platelets: 149 10*3/uL — ABNORMAL LOW (ref 150–400)
RBC: 3.33 MIL/uL — AB (ref 4.22–5.81)
RDW: 12.7 % (ref 11.5–15.5)
WBC: 10.5 10*3/uL (ref 4.0–10.5)

## 2015-07-08 LAB — BASIC METABOLIC PANEL
ANION GAP: 6 (ref 5–15)
BUN: 8 mg/dL (ref 6–20)
CHLORIDE: 107 mmol/L (ref 101–111)
CO2: 27 mmol/L (ref 22–32)
CREATININE: 0.7 mg/dL (ref 0.61–1.24)
Calcium: 9.2 mg/dL (ref 8.9–10.3)
GFR calc non Af Amer: >60 mL/min (ref >60)
Glucose, Bld: 168 mg/dL — ABNORMAL HIGH (ref 65–99)
Potassium: 4.2 mmol/L (ref 3.5–5.1)
SODIUM: 140 mmol/L (ref 135–145)

## 2015-07-08 MED ORDER — TRAMADOL HCL 50 MG PO TABS: 50.0000 mg | ORAL_TABLET | Freq: Four times a day (QID) | ORAL | Status: DC | PRN

## 2015-07-08 MED ORDER — METHOCARBAMOL 500 MG PO TABS: 500.0000 mg | ORAL_TABLET | Freq: Four times a day (QID) | ORAL | Status: DC | PRN

## 2015-07-08 MED ORDER — LIP MEDEX EX OINT
TOPICAL_OINTMENT | CUTANEOUS | Status: AC
  Filled 2015-07-08: qty 7

## 2015-07-08 MED ORDER — RIVAROXABAN 10 MG PO TABS: 10.0000 mg | ORAL_TABLET | Freq: Every day | ORAL | Status: DC

## 2015-07-08 MED ORDER — OXYCODONE HCL 5 MG PO TABS: 5.0000 mg | ORAL_TABLET | ORAL | Status: DC | PRN

## 2015-07-08 NOTE — Progress Notes (Signed)
   Subjective: 1 Day Post-Op Procedure(s) (LRB): LEFT TOTAL KNEE ARTHROPLASTY (Left) Patient reports pain as mild.   Patient seen in rounds with Dr. [name].  Wife in room at bedside. Patient is well, but has had some minor complaints of pain in the knee, requiring pain medications We will start therapy today.  Plan is to go Home after hospital stay.  Objective: Vital signs in last 24 hours: Temp:  [97.4 F (36.3 C)-98.4 F (36.9 C)] 98.4 F (36.9 C) (01/17 0556) Pulse Rate:  [65-96] 65 (01/17 0556) Resp:  [14-18] 18 (01/17 0556) BP: (121-166)/(62-86) 122/62 mmHg (01/17 0556) SpO2:  [96 %-100 %] 99 % (01/17 0556) Weight:  [82.555 kg (182 lb)] 82.555 kg (182 lb) (01/16 1012)  Intake/Output from previous day:  Intake/Output Summary (Last 24 hours) at 07/08/15 0821 Last data filed at 07/08/15 0756  Gross per 24 hour  Intake   2880 ml  Output   1615 ml  Net   1265 ml    Intake/Output this shift: Total I/O In: 240 [P.O.:240] Out: 100 [Urine:100]  Labs:  Recent Labs  07/08/15 0422  HGB 10.1*    Recent Labs  07/08/15 0422  WBC 10.5  RBC 3.33*  HCT 30.6*  PLT 149*    Recent Labs  07/08/15 0422  NA 140  K 4.2  CL 107  CO2 27  BUN 8  CREATININE 0.70  GLUCOSE 168*  CALCIUM 9.2   No results for input(s): LABPT, INR in the last 72 hours.  EXAM General - Patient is Alert, Appropriate and Oriented Extremity - Neurovascular intact Sensation intact distally Dressing - dressing C/D/I Motor Function - intact, moving foot and toes well on exam.  Hemovac pulled without difficulty.  Past Medical History  Diagnosis Date  . Hypertension   . Hyperlipidemia   . Anxiety   . DJD (degenerative joint disease) of knee   . Gout   . HSV-1 (herpes simplex virus 1) infection   . History of right shoulder fracture   . History of weight change     lost 30 lbs over last 5 years   . Fatigue   . Balance problems   . Back pain     severe    Assessment/Plan: 1 Day  Post-Op Procedure(s) (LRB): LEFT TOTAL KNEE ARTHROPLASTY (Left) Principal Problem:   OA (osteoarthritis) of knee  Estimated body mass index is 25.03 kg/(m^2) as calculated from the following:   Height as of this encounter: 5' 11.5" (1.816 m).   Weight as of this encounter: 82.555 kg (182 lb). Advance diet Up with therapy Plan for discharge tomorrow Discharge home with home health  DVT Prophylaxis - Xarelto Weight-Bearing as tolerated to left leg D/C O2 and Pulse OX and try on Room Air  Arlee Muslim, PA-C Orthopaedic Surgery 07/08/2015, 8:21 AM

## 2015-07-08 NOTE — Progress Notes (Signed)
Physical Therapy Treatment Patient Details Name: Shaun Carey MRN: [id number] DOB: 1940/07/02 Today's Date: 07/08/2015    History of Present Illness L TKA, h/o gout, "balance problem" (pt reports due to leg length discrepancy), LBP    PT Comments    Pt progressing well with mobility, he ambulated 150' with RW and performed TKA exercises with min A. Will do stair training tomorrow morning, then expect pt will be ready to DC home from PT standpoint.  Follow Up Recommendations  Home health PT     Equipment Recommendations  Rolling walker with 5" wheels;3in1 (PT)    Recommendations for Other Services       Precautions / Restrictions Precautions Precautions: Knee;Fall Precaution Comments: pt's wife reports pt has had 2 falls in past year Restrictions Other Position/Activity Restrictions: WBAT    Mobility  Bed Mobility Overal bed mobility: Needs Assistance Bed Mobility: Supine to Sit     Supine to sit: Min assist     General bed mobility comments: in recliner  Transfers Overall transfer level: Needs assistance Equipment used: Rolling walker (2 wheeled) Transfers: Sit to/from Stand Sit to Stand: Supervision         General transfer comment: verbal cues for hand placement  Ambulation/Gait Ambulation/Gait assistance: Min guard Ambulation Distance (Feet): 150 Feet Assistive device: Rolling walker (2 wheeled) Gait Pattern/deviations: Step-to pattern   Gait velocity interpretation: Below normal speed for age/gender General Gait Details: improved positioning in RW, steady, no LOB, improved posture   Stairs            Wheelchair Mobility    Modified Rankin (Stroke Patients Only)       Balance Overall balance assessment: Needs assistance   Sitting balance-Leahy Scale: Good     Standing balance support: Bilateral upper extremity supported Standing balance-Leahy Scale: Poor                      Cognition Arousal/Alertness:  Awake/alert Behavior During Therapy: WFL for tasks assessed/performed Overall Cognitive Status: Within Functional Limits for tasks assessed                      Exercises Total Joint Exercises Ankle Circles/Pumps: AROM;Both;10 reps;Supine Quad Sets: AROM;Both;10 reps;Supine Short Arc Quad: AAROM;Left;10 reps;Supine Heel Slides: AAROM;Left;10 reps;Supine Hip ABduction/ADduction: AAROM;Left;10 reps;Supine Straight Leg Raises: AAROM;Left;5 reps;Supine Goniometric ROM: 0-45* AAROM L knee    General Comments        Pertinent Vitals/Pain Pain Assessment: 0-10 Pain Score: 5  Pain Location: L knee Pain Descriptors / Indicators: Sore Pain Intervention(s): Premedicated before session;Monitored during session;Limited activity within patient's tolerance;Ice applied    Home Living Family/patient expects to be discharged to:: Private residence Living Arrangements: Alone Available Help at Discharge: Family;Available 24 hours/day Type of Home: House Home Access: Stairs to enter Entrance Stairs-Rails: None Home Layout: One level Home Equipment: None      Prior Function Level of Independence: Independent      Comments: standing tolerance limited to 3-4 minutes due to LBP, he has not had back worked up   [employer] (current goals can now be found in the care plan section) Acute Rehab PT Goals Patient Stated Goal: to go deer hunting and fishing PT Goal Formulation: With patient/family Time For Goal Achievement: 07/15/15 Potential to Achieve Goals: Good Progress towards PT goals: Progressing toward goals    Frequency  7X/week    PT Plan Current plan remains appropriate    Co-evaluation  End of Session Equipment Utilized During Treatment: Gait belt;Left knee immobilizer Activity Tolerance: Patient tolerated treatment well Patient left: in chair;with call bell/phone within reach;with family/visitor present     Time: [id number] PT Time Calculation (min)  (ACUTE ONLY): 25 min  Charges:  $Gait Training: 8-22 mins $Therapeutic Exercise: 8-22 mins                    G Codes:      Philomena Doheny 07/08/2015, 2:31 PM [phone]

## 2015-07-08 NOTE — Care Management Note (Signed)
Case Management Note  Patient Details  Name: Shaun Carey MRN: [id number] Date of Birth: [birth year]  Subjective/Objective:                   LEFT TOTAL KNEE ARTHROPLASTY (Left) Action/Plan: Discharge planning  Expected Discharge Date:  07/09/15               Expected Discharge Plan:  [place]  In-House Referral:     Discharge planning Services  CM Consult  Post Acute Care Choice:  Home Health Choice offered to:  Patient  DME Arranged:  3-N-1, Walker rolling DME Agency:  [place]:  PT [place] Agency:  [place]  Status of Service:  Completed, signed off  Medicare Important Message Given:    Date Medicare IM Given:    Medicare IM give by:    Date Additional Medicare IM Given:    Additional Medicare Important Message give by:     If discussed at [place] of Stay Meetings, dates discussed:    Additional Comments: CM met with pt in room to offer choice of home health agency.  Pt chooses [facility] to render HHPT.  Referral given to [employer], Tim.  CM called [facility] DME rep, Lecretia to please deliver the rolling walker and 3n1 to room prior to discharge. No other CM needs were communicated. Dellie Catholic, RN 07/08/2015, 2:22 PM

## 2015-07-08 NOTE — Discharge Instructions (Addendum)
Dr. Gaynelle Arabian Total Joint Specialist [facility] [address], [place], [place] [address] [phone]  TOTAL KNEE REPLACEMENT POSTOPERATIVE DIRECTIONS  Knee Rehabilitation, Guidelines Following Surgery  Results after knee surgery are often greatly improved when you follow the exercise, range of motion and muscle strengthening exercises prescribed by your doctor. Safety measures are also important to protect the knee from further injury. Any time any of these exercises cause you to have increased pain or swelling in your knee joint, decrease the amount until you are comfortable again and slowly increase them. If you have problems or questions, call your caregiver or physical therapist for advice.   HOME CARE INSTRUCTIONS  Remove items at home which could result in a fall. This includes throw rugs or furniture in walking pathways.   ICE to the affected knee every three hours for 30 minutes at a time and then as needed for pain and swelling.  Continue to use ice on the knee for pain and swelling from surgery. You may notice swelling that will progress down to the foot and ankle.  This is normal after surgery.  Elevate the leg when you are not up walking on it.    Continue to use the breathing machine which will help keep your temperature down.  It is common for your temperature to cycle up and down following surgery, especially at night when you are not up moving around and exerting yourself.  The breathing machine keeps your lungs expanded and your temperature down.  Do not place pillow under knee, focus on keeping the knee straight while resting  DIET You may resume your previous home diet once your are discharged from the hospital.  DRESSING / WOUND CARE / SHOWERING You may shower 3 days after surgery, but keep the wounds dry during showering.  You may use an occlusive plastic wrap (Press'n Seal for example), NO SOAKING/SUBMERGING IN THE BATHTUB.  If the  bandage gets wet, change with a clean dry gauze.  If the incision gets wet, pat the wound dry with a clean towel. You may start showering once you are discharged home but do not submerge the incision under water. Just pat the incision dry and apply a dry gauze dressing on daily. Change the surgical dressing daily and reapply a dry dressing each time.  Leave the clear Tegaderms coverings over the blisters until the return follow up appointment next Tuesday 07/15/2015 with Dr. [name].  ACTIVITY Walk with your walker as instructed. Use walker as long as suggested by your caregivers. Avoid periods of inactivity such as sitting longer than an hour when not asleep. This helps prevent blood clots.  You may resume a sexual relationship in one month or when given the OK by your doctor.  You may return to work once you are cleared by your doctor.  Do not drive a car for 6 weeks or until released by you surgeon.  Do not drive while taking narcotics.  WEIGHT BEARING Weight bearing as tolerated with assist device (walker, cane, etc) as directed, use it as long as suggested by your surgeon or therapist, typically at least 4-6 weeks.  POSTOPERATIVE CONSTIPATION PROTOCOL Constipation - defined medically as fewer than three stools per week and severe constipation as less than one stool per week.  One of the most common issues patients have following surgery is constipation.  Even if you have a regular bowel pattern at home, your normal regimen is likely to be disrupted due to multiple  reasons following surgery.  Combination of anesthesia, postoperative narcotics, change in appetite and fluid intake all can affect your bowels.  In order to avoid complications following surgery, here are some recommendations in order to help you during your recovery period.  Colace (docusate) - Pick up an over-the-counter form of Colace or another stool softener and take twice a day as long as you are requiring postoperative  pain medications.  Take with a full glass of water daily.  If you experience loose stools or diarrhea, hold the colace until you stool forms back up.  If your symptoms do not get better within 1 week or if they get worse, check with your doctor.  Dulcolax (bisacodyl) - Pick up over-the-counter and take as directed by the product packaging as needed to assist with the movement of your bowels.  Take with a full glass of water.  Use this product as needed if not relieved by Colace only.   MiraLax (polyethylene glycol) - Pick up over-the-counter to have on hand.  MiraLax is a solution that will increase the amount of water in your bowels to assist with bowel movements.  Take as directed and can mix with a glass of water, juice, soda, coffee, or tea.  Take if you go more than two days without a movement. Do not use MiraLax more than once per day. Call your doctor if you are still constipated or irregular after using this medication for 7 days in a row.  If you continue to have problems with postoperative constipation, please contact the office for further assistance and recommendations.  If you experience "the worst abdominal pain ever" or develop nausea or vomiting, please contact the office immediatly for further recommendations for treatment.  ITCHING  If you experience itching with your medications, try taking only a single pain pill, or even half a pain pill at a time.  You can also use Benadryl over the counter for itching or also to help with sleep.   TED HOSE STOCKINGS Wear the elastic stockings on both legs for three weeks following surgery during the day but you may remove then at night for sleeping.  MEDICATIONS See your medication summary on the After Visit Summary that the nursing staff will review with you prior to discharge.  You may have some home medications which will be placed on hold until you complete the course of blood thinner medication.  It is important for you to complete the  blood thinner medication as prescribed by your surgeon.  Continue your approved medications as instructed at time of discharge.  PRECAUTIONS If you experience chest pain or shortness of breath - call 911 immediately for transfer to the hospital emergency department.  If you develop a fever greater that 101 F, purulent drainage from wound, increased redness or drainage from wound, foul odor from the wound/dressing, or calf pain - CONTACT YOUR SURGEON.                                                   FOLLOW-UP APPOINTMENTS Make sure you keep all of your appointments after your operation with your surgeon and caregivers. You should call the office at the above phone number and make an appointment for approximately two weeks after the date of your surgery or on the date instructed by your surgeon outlined in the "After Visit  Summary".   RANGE OF MOTION AND STRENGTHENING EXERCISES  Rehabilitation of the knee is important following a knee injury or an operation. After just a few days of immobilization, the muscles of the thigh which control the knee become weakened and shrink (atrophy). Knee exercises are designed to build up the tone and strength of the thigh muscles and to improve knee motion. Often times heat used for twenty to thirty minutes before working out will loosen up your tissues and help with improving the range of motion but do not use heat for the first two weeks following surgery. These exercises can be done on a training (exercise) mat, on the floor, on a table or on a bed. Use what ever works the best and is most comfortable for you Knee exercises include:  Leg Lifts - While your knee is still immobilized in a splint or cast, you can do straight leg raises. Lift the leg to 60 degrees, hold for 3 sec, and slowly lower the leg. Repeat 10-20 times 2-3 times daily. Perform this exercise against resistance later as your knee gets better.  Quad and Hamstring Sets - Tighten up the muscle on the  front of the thigh (Quad) and hold for 5-10 sec. Repeat this 10-20 times hourly. Hamstring sets are done by pushing the foot backward against an object and holding for 5-10 sec. Repeat as with quad sets.   Leg Slides: Lying on your back, slowly slide your foot toward your buttocks, bending your knee up off the floor (only go as far as is comfortable). Then slowly slide your foot back down until your leg is flat on the floor again.  Angel Wings: Lying on your back spread your legs to the side as far apart as you can without causing discomfort.  A rehabilitation program following serious knee injuries can speed recovery and prevent re-injury in the future due to weakened muscles. Contact your doctor or a physical therapist for more information on knee rehabilitation.   IF YOU ARE TRANSFERRED TO A SKILLED REHAB FACILITY If the patient is transferred to a skilled rehab facility following release from the hospital, a list of the current medications will be sent to the facility for the patient to continue.  When discharged from the skilled rehab facility, please have the facility set up the patient's [place] prior to being released. Also, the skilled facility will be responsible for providing the patient with their medications at time of release from the facility to include their pain medication, the muscle relaxants, and their blood thinner medication. If the patient is still at the rehab facility at time of the two week follow up appointment, the skilled rehab facility will also need to assist the patient in arranging follow up appointment in our office and any transportation needs.  MAKE SURE YOU:  Understand these instructions.  Get help right away if you are not doing well or get worse.    Pick up stool softner and laxative for home use following surgery while on pain medications. Do not submerge incision under water. Please use good hand washing techniques while changing  dressing each day. May shower starting three days after surgery. Please use a clean towel to pat the incision dry following showers. Continue to use ice for pain and swelling after surgery. Do not use any lotions or creams on the incision until instructed by your surgeon.  Take Xarelto for two and a half more weeks, then discontinue Xarelto. Once the patient  has completed the blood thinner regimen, then take a Baby 81 mg Aspirin daily for three more weeks.  Information on my medicine - XARELTO (Rivaroxaban)  This medication education was reviewed with me or my healthcare representative as part of my discharge preparation.  The pharmacist that spoke with me during my hospital stay was:  Kara Mead, [facility]  Why was Xarelto prescribed for you? Xarelto was prescribed for you to reduce the risk of blood clots forming after orthopedic surgery. The medical term for these abnormal blood clots is venous thromboembolism (VTE).  What do you need to know about xarelto ? Take your Xarelto ONCE DAILY at the same time every day. You may take it either with or without food.  If you have difficulty swallowing the tablet whole, you may crush it and mix in applesauce just prior to taking your dose.  Take Xarelto exactly as prescribed by your doctor and DO NOT stop taking Xarelto without talking to the doctor who prescribed the medication.  Stopping without other VTE prevention medication to take the place of Xarelto may increase your risk of developing a clot.  After discharge, you should have regular check-up appointments with your healthcare provider that is prescribing your Xarelto.    What do you do if you miss a dose? If you miss a dose, take it as soon as you remember on the same day then continue your regularly scheduled once daily regimen the next day. Do not take two doses of Xarelto on the same day.   Important Safety Information A possible side effect of Xarelto is bleeding.  You should call your healthcare provider right away if you experience any of the following: ? Bleeding from an injury or your nose that does not stop. ? Unusual colored urine (red or dark brown) or unusual colored stools (red or black). ? Unusual bruising for unknown reasons. ? A serious fall or if you hit your head (even if there is no bleeding).  Some medicines may interact with Xarelto and might increase your risk of bleeding while on Xarelto. To help avoid this, consult your healthcare provider or pharmacist prior to using any new prescription or non-prescription medications, including herbals, vitamins, non-steroidal anti-inflammatory drugs (NSAIDs) and supplements.  This website has more information on Xarelto: [email or web address]

## 2015-07-08 NOTE — Progress Notes (Signed)
Occupational Therapy Evaluation Patient Details Name: Shaun Carey MRN: [id number] DOB: 07-Aug-1940 Today's Date: 07/08/2015    History of Present Illness L TKA, h/o gout, "balance problem" (pt reports due to leg length discrepancy), LBP   Clinical Impression   Pt admitted with the above diagnoses and presents with below problem list. Pt will benefit from continued acute OT to address the below listed deficits and maximize independence with BADLs prior to d/c home with family assisting. PTA pt was independent with ADLs. Pt is currently min guard with LB ADLs and fucntional mobility/transfers. ADL education and home setup for toilet/shower transfers discussed. OT to follow acutely.      Follow Up Recommendations  No OT follow up;Supervision - Intermittent;Other (comment) (OOB/mobility)    Equipment Recommendations  3 in 1 bedside comode    Recommendations for Other Services       Precautions / Restrictions Precautions Precautions: Knee;Fall Precaution Comments: pt's wife reports pt has had 2 falls in past year Restrictions Other Position/Activity Restrictions: WBAT      Mobility Bed Mobility Overal bed mobility: Needs Assistance Bed Mobility: Supine to Sit     Supine to sit: Min assist     General bed mobility comments: in recliner  Transfers Overall transfer level: Needs assistance Equipment used: Rolling walker (2 wheeled) Transfers: Sit to/from Stand Sit to Stand: Min guard         General transfer comment: verbal cues for hand placement    Balance Overall balance assessment: Needs assistance   Sitting balance-Leahy Scale: Good     Standing balance support: Bilateral upper extremity supported Standing balance-Leahy Scale: Poor                              ADL Overall ADL's : Needs assistance/impaired Eating/Feeding: Set up;Sitting   Grooming: Set up;Min guard;Sitting;Standing   Upper Body Bathing: Set up;Sitting   Lower Body  Bathing: Min guard   Upper Body Dressing : Set up;Sitting   Lower Body Dressing: Min guard;Sit to/from stand   Toilet Transfer: Min guard;Ambulation;BSC;RW   Toileting- Clothing Manipulation and Hygiene: Set up;Sitting/lateral lean   Tub/ Shower Transfer: Min guard;Walk-in shower;Ambulation;3 in 1;Rolling walker   Functional mobility during ADLs: Min guard;Rolling walker General ADL Comments: ADL education provided. Pt completed in-room functional mobility. Discussed home setup for toilet/shower transfers.     Vision     Perception     Praxis      Pertinent Vitals/Pain Pain Assessment: 0-10 Pain Score: 5  Pain Location: L knee Pain Descriptors / Indicators: Sore Pain Intervention(s): Premedicated before session;Monitored during session;Limited activity within patient's tolerance;Repositioned;Ice applied     Hand Dominance     Extremity/Trunk Assessment Upper Extremity Assessment Upper Extremity Assessment: Overall WFL for tasks assessed   Lower Extremity Assessment Lower Extremity Assessment: Defer to PT evaluation LLE Deficits / Details: 0-40* AAROM L knee, SLR -3/5, ankle WNL   Cervical / Trunk Assessment Cervical / Trunk Assessment: Normal   Communication Communication Communication: No difficulties   Cognition Arousal/Alertness: Awake/alert Behavior During Therapy: WFL for tasks assessed/performed Overall Cognitive Status: Within Functional Limits for tasks assessed                     General Comments       Exercises       Shoulder Instructions      Home Living Family/patient expects to be discharged to:: Private residence Living Arrangements: Alone  Available Help at Discharge: Family;Available 24 hours/day Type of Home: House Home Access: Stairs to enter [employer] of Steps: 2 Entrance Stairs-Rails: None Home Layout: One level     Bathroom Shower/Tub: Occupational psychologist: Standard     Home Equipment:  None          Prior Functioning/Environment Level of Independence: Independent        Comments: standing tolerance limited to 3-4 minutes due to LBP, he has not had back worked up    OT Diagnosis: Acute pain   OT Problem List: Impaired balance (sitting and/or standing);Decreased knowledge of use of DME or AE;Decreased knowledge of precautions;Pain   OT Treatment/Interventions: Self-care/ADL training;DME and/or AE instruction;Therapeutic activities;Balance training;Patient/family education    OT Goals(Current goals can be found in the care plan section) Acute Rehab OT Goals Patient Stated Goal: to recover as quickly as I can OT Goal Formulation: With patient Time For Goal Achievement: 07/15/15 Potential to Achieve Goals: Good ADL Goals Pt Will Perform Lower Body Bathing: with modified independence;with adaptive equipment;sit to/from stand Pt Will Perform Lower Body Dressing: with modified independence;with adaptive equipment;sit to/from stand Pt Will Transfer to Toilet: with modified independence;ambulating;bedside commode Pt Will Perform Tub/Shower Transfer: Shower transfer;with modified independence;ambulating;3 in 1;rolling walker  OT Frequency: Min 2X/week   Barriers to D/C:            Co-evaluation              End of Session Equipment Utilized During Treatment: Gait belt;Rolling walker  Activity Tolerance: Patient tolerated treatment well Patient left: in chair;with call bell/phone within reach;with family/visitor present   Time: 1130-1150 OT Time Calculation (min): 20 min Charges:  OT General Charges $OT Visit: 1 Procedure OT Evaluation $OT Eval Low Complexity: 1 Procedure G-Codes:    Hortencia Pilar 2015/07/23, 12:46 PM

## 2015-07-08 NOTE — Discharge Summary (Signed)
Physician Discharge Summary   Patient ID: Shaun Carey MRN: [id number] DOB/AGE: 02-05-1941 75 y.o.  Admit date: 07/07/2015 Discharge date: 07-10-2015  Primary Diagnosis:  Osteoarthritis Left knee(s)  Admission Diagnoses:  Past Medical History  Diagnosis Date  . Hypertension   . Hyperlipidemia   . Anxiety   . DJD (degenerative joint disease) of knee   . Gout   . HSV-1 (herpes simplex virus 1) infection   . History of right shoulder fracture   . History of weight change     lost 30 lbs over last 5 years   . Fatigue   . Balance problems   . Back pain     severe   Discharge Diagnoses:   Principal Problem:   OA (osteoarthritis) of knee  Estimated body mass index is 25.03 kg/(m^2) as calculated from the following:   Height as of this encounter: 5' 11.5" (1.816 m).   Weight as of this encounter: 82.555 kg (182 lb).  Procedure:  Procedure(s) (LRB): LEFT TOTAL KNEE ARTHROPLASTY (Left)   Consults: None  HPI: Shaun Carey is a 75 y.o. year old male with end stage OA of his left knee with progressively worsening pain and dysfunction. He has constant pain, with activity and at rest and significant functional deficits with difficulties even with ADLs. He has had extensive non-op management including analgesics, injections of cortisone, and home exercise program, but remains in significant pain with significant dysfunction. Radiographs show bone on bone arthritis medial and patellofemoral. He presents now for left Total Knee Arthroplasty.   Laboratory Data: Admission on 07/07/2015  Component Date Value Ref Range Status  . WBC 07/08/2015 10.5  4.0 - 10.5 K/uL Final  . RBC 07/08/2015 3.33* 4.22 - 5.81 MIL/uL Final  . Hemoglobin 07/08/2015 10.1* 13.0 - 17.0 g/dL Final  . HCT 07/08/2015 30.6* 39.0 - 52.0 % Final  . MCV 07/08/2015 91.9  78.0 - 100.0 fL Final  . MCH 07/08/2015 30.3  26.0 - 34.0 pg Final  . MCHC 07/08/2015 33.0  30.0 - 36.0 g/dL Final  . RDW 07/08/2015 12.7   11.5 - 15.5 % Final  . Platelets 07/08/2015 149* 150 - 400 K/uL Final  . Sodium 07/08/2015 140  135 - 145 mmol/L Final  . Potassium 07/08/2015 4.2  3.5 - 5.1 mmol/L Final  . Chloride 07/08/2015 107  101 - 111 mmol/L Final  . CO2 07/08/2015 27  22 - 32 mmol/L Final  . Glucose, Bld 07/08/2015 168* 65 - 99 mg/dL Final  . BUN 07/08/2015 8  6 - 20 mg/dL Final  . Creatinine, Ser 07/08/2015 0.70  0.61 - 1.24 mg/dL Final  . Calcium 07/08/2015 9.2  8.9 - 10.3 mg/dL Final  . GFR calc non Af Amer 07/08/2015 >60  >60 mL/min Final  . GFR calc Af Amer 07/08/2015 >60  >60 mL/min Final   Comment: (NOTE) The eGFR has been calculated using the CKD EPI equation. This calculation has not been validated in all clinical situations. eGFR's persistently <60 mL/min signify possible Chronic Kidney Disease.   . Anion gap 07/08/2015 6  5 - 15 Final  . WBC 07/09/2015 11.6* 4.0 - 10.5 K/uL Final  . RBC 07/09/2015 2.87* 4.22 - 5.81 MIL/uL Final  . Hemoglobin 07/09/2015 8.8* 13.0 - 17.0 g/dL Final  . HCT 07/09/2015 26.3* 39.0 - 52.0 % Final  . MCV 07/09/2015 91.6  78.0 - 100.0 fL Final  . MCH 07/09/2015 30.7  26.0 - 34.0 pg Final  . MCHC 07/09/2015  33.5  30.0 - 36.0 g/dL Final  . RDW 07/09/2015 12.8  11.5 - 15.5 % Final  . Platelets 07/09/2015 159  150 - 400 K/uL Final  . Sodium 07/09/2015 140  135 - 145 mmol/L Final  . Potassium 07/09/2015 3.6  3.5 - 5.1 mmol/L Final  . Chloride 07/09/2015 102  101 - 111 mmol/L Final  . CO2 07/09/2015 27  22 - 32 mmol/L Final  . Glucose, Bld 07/09/2015 102* 65 - 99 mg/dL Final  . BUN 07/09/2015 9  6 - 20 mg/dL Final  . Creatinine, Ser 07/09/2015 0.65  0.61 - 1.24 mg/dL Final  . Calcium 07/09/2015 8.9  8.9 - 10.3 mg/dL Final  . GFR calc non Af Amer 07/09/2015 >60  >60 mL/min Final  . GFR calc Af Amer 07/09/2015 >60  >60 mL/min Final   Comment: (NOTE) The eGFR has been calculated using the CKD EPI equation. This calculation has not been validated in all clinical  situations. eGFR's persistently <60 mL/min signify possible Chronic Kidney Disease.   . Anion gap 07/09/2015 11  5 - 15 Final  . WBC 07/10/2015 11.8* 4.0 - 10.5 K/uL Final  . RBC 07/10/2015 2.50* 4.22 - 5.81 MIL/uL Final  . Hemoglobin 07/10/2015 7.6* 13.0 - 17.0 g/dL Final  . HCT 07/10/2015 22.9* 39.0 - 52.0 % Final  . MCV 07/10/2015 91.6  78.0 - 100.0 fL Final  . MCH 07/10/2015 30.4  26.0 - 34.0 pg Final  . MCHC 07/10/2015 33.2  30.0 - 36.0 g/dL Final  . RDW 07/10/2015 12.9  11.5 - 15.5 % Final  . Platelets 07/10/2015 147* 150 - 400 K/uL Final  Hospital Outpatient Visit on 07/01/2015  Component Date Value Ref Range Status  . MRSA, PCR 07/01/2015 NEGATIVE  NEGATIVE Final  . Staphylococcus aureus 07/01/2015 POSITIVE* NEGATIVE Final   Comment:        The Xpert SA Assay (FDA approved for NASAL specimens in patients over 74 years of age), is one component of a comprehensive surveillance program.  Test performance has been validated by [facility] for patients greater than or equal to 74 year old. It is not intended to diagnose infection nor to guide or monitor treatment.   Marland Kitchen aPTT 07/01/2015 31  24 - 37 seconds Final  . WBC 07/01/2015 9.4  4.0 - 10.5 K/uL Final  . RBC 07/01/2015 4.36  4.22 - 5.81 MIL/uL Final  . Hemoglobin 07/01/2015 13.2  13.0 - 17.0 g/dL Final  . HCT 07/01/2015 40.6  39.0 - 52.0 % Final  . MCV 07/01/2015 93.1  78.0 - 100.0 fL Final  . MCH 07/01/2015 30.3  26.0 - 34.0 pg Final  . MCHC 07/01/2015 32.5  30.0 - 36.0 g/dL Final  . RDW 07/01/2015 12.7  11.5 - 15.5 % Final  . Platelets 07/01/2015 215  150 - 400 K/uL Final  . Sodium 07/01/2015 141  135 - 145 mmol/L Final  . Potassium 07/01/2015 4.4  3.5 - 5.1 mmol/L Final  . Chloride 07/01/2015 104  101 - 111 mmol/L Final  . CO2 07/01/2015 30  22 - 32 mmol/L Final  . Glucose, Bld 07/01/2015 104* 65 - 99 mg/dL Final  . BUN 07/01/2015 11  6 - 20 mg/dL Final  . Creatinine, Ser 07/01/2015 0.78  0.61 - 1.24 mg/dL Final   . Calcium 07/01/2015 10.0  8.9 - 10.3 mg/dL Final  . Total Protein 07/01/2015 7.5  6.5 - 8.1 g/dL Final  . Albumin 07/01/2015 4.1  3.5 -  5.0 g/dL Final  . AST 07/01/2015 26  15 - 41 U/L Final  . ALT 07/01/2015 15* 17 - 63 U/L Final  . Alkaline Phosphatase 07/01/2015 107  38 - 126 U/L Final  . Total Bilirubin 07/01/2015 0.6  0.3 - 1.2 mg/dL Final  . GFR calc non Af Amer 07/01/2015 >60  >60 mL/min Final  . GFR calc Af Amer 07/01/2015 >60  >60 mL/min Final   Comment: (NOTE) The eGFR has been calculated using the CKD EPI equation. This calculation has not been validated in all clinical situations. eGFR's persistently <60 mL/min signify possible Chronic Kidney Disease.   . Anion gap 07/01/2015 7  5 - 15 Final  . Prothrombin Time 07/01/2015 14.4  11.6 - 15.2 seconds Final  . INR 07/01/2015 1.10  0.00 - 1.49 Final  . ABO/RH(D) 07/01/2015 O NEG   Final  . Antibody Screen 07/01/2015 NEG   Final  . Sample Expiration 07/01/2015 07/15/2015   Final  . Extend sample reason 07/01/2015 NO TRANSFUSIONS OR PREGNANCY IN THE PAST 3 MONTHS   Final  . Color, Urine 07/01/2015 YELLOW  YELLOW Final  . APPearance 07/01/2015 CLEAR  CLEAR Final  . Specific Gravity, Urine 07/01/2015 1.018  1.005 - 1.030 Final  . pH 07/01/2015 7.0  5.0 - 8.0 Final  . Glucose, UA 07/01/2015 NEGATIVE  NEGATIVE mg/dL Final  . Hgb urine dipstick 07/01/2015 NEGATIVE  NEGATIVE Final  . Bilirubin Urine 07/01/2015 NEGATIVE  NEGATIVE Final  . Ketones, ur 07/01/2015 NEGATIVE  NEGATIVE mg/dL Final  . Protein, ur 07/01/2015 NEGATIVE  NEGATIVE mg/dL Final  . Nitrite 07/01/2015 NEGATIVE  NEGATIVE Final  . Leukocytes, UA 07/01/2015 NEGATIVE  NEGATIVE Final   MICROSCOPIC NOT DONE ON URINES WITH NEGATIVE PROTEIN, BLOOD, LEUKOCYTES, NITRITE, OR GLUCOSE <1000 mg/dL.  . ABO/RH(D) 07/01/2015 O NEG   Final     X-Rays:No results found.  EKG: Orders placed or performed during the hospital encounter of 07/01/15  . EKG 12 lead  . EKG 12 lead      Hospital Course: Shaun Carey is a 75 y.o. who was admitted to [facility]. They were brought to the operating room on 07/07/2015 and underwent Procedure(s): LEFT TOTAL KNEE ARTHROPLASTY.  Patient tolerated the procedure well and was later transferred to the recovery room and then to the orthopaedic floor for postoperative care.  They were given PO and IV analgesics for pain control following their surgery.  They were given 24 hours of postoperative antibiotics of  Anti-infectives    Start     Dose/Rate Route Frequency Ordered Stop   07/07/15 2200  acyclovir (ZOVIRAX) tablet 400 mg     400 mg Oral 2 times daily 07/07/15 1520     07/07/15 1830  ceFAZolin (ANCEF) IVPB 2 g/50 mL premix     2 g 100 mL/hr over 30 Minutes Intravenous Every 6 hours 07/07/15 1520 07/08/15 0129   07/07/15 0955  ceFAZolin (ANCEF) IVPB 2 g/50 mL premix     2 g 100 mL/hr over 30 Minutes Intravenous On call to O.R. 07/07/15 9702 07/07/15 1232     and started on DVT prophylaxis in the form of Xarelto.   PT and OT were ordered for total joint protocol.  Discharge planning consulted to help with postop disposition and equipment needs.  Patient had a tough night on the evening of surgery.  They started to get up OOB with therapy on day one. Hemovac drain was pulled without difficulty.  Continued to  work with therapy into day two.  Dressing was changed on day two and the incision looked okay.  Blisters noted over distal incision and medial knee.  Received authorization for Xarelto.  Monitored the labs and encouraged elevation of the leg when not up ambulating.  Spiked a temp that evening but resolved with treatment.  Encourage the I.S.  By day three, the patient had progressed, temperature improved, labs were stable, and meeting their goals.  Incision and blisters were stable..  Patient was seen in rounds and was ready to go home.  Discharge home with home health Diet - Cardiac diet Follow up - in 2 weeks Activity -  WBAT Disposition - Home Condition Upon Discharge - Stable D/C Meds - See DC Summary DVT Prophylaxis - Xarelto      Discharge Instructions    Call MD / Call 911    Complete by:  As directed   If you experience chest pain or shortness of breath, CALL 911 and be transported to the hospital emergency room.  If you develope a fever above 101 F, pus (white drainage) or increased drainage or redness at the wound, or calf pain, call your surgeon's office.     Change dressing    Complete by:  As directed   Change dressing daily with sterile 4 x 4 inch gauze dressing and apply TED hose. Do not submerge the incision under water.  Leave the clear Tegaderms in place over the blisters until return to the office for follow up and recheck.     Constipation Prevention    Complete by:  As directed   Drink plenty of fluids.  Prune juice may be helpful.  You may use a stool softener, such as Colace (over the counter) 100 mg twice a day.  Use MiraLax (over the counter) for constipation as needed.     Diet - low sodium heart healthy    Complete by:  As directed      Discharge instructions    Complete by:  As directed   Pick up stool softner and laxative for home use following surgery while on pain medications. Do not submerge incision under water. Please use good hand washing techniques while changing dressing each day. May shower starting three days after surgery. Please use a clean towel to pat the incision dry following showers. Continue to use ice for pain and swelling after surgery. Do not use any lotions or creams on the incision until instructed by your surgeon.  Take Xarelto for two and a half more weeks, then discontinue Xarelto. Once the patient has completed the blood thinner regimen, then take a Baby 81 mg Aspirin daily for three more weeks.  Postoperative Constipation Protocol  Constipation - defined medically as fewer than three stools per week and severe constipation as less than one  stool per week.  One of the most common issues patients have following surgery is constipation.  Even if you have a regular bowel pattern at home, your normal regimen is likely to be disrupted due to multiple reasons following surgery.  Combination of anesthesia, postoperative narcotics, change in appetite and fluid intake all can affect your bowels.  In order to avoid complications following surgery, here are some recommendations in order to help you during your recovery period.  Colace (docusate) - Pick up an over-the-counter form of Colace or another stool softener and take twice a day as long as you are requiring postoperative pain medications.  Take with a full glass of  water daily.  If you experience loose stools or diarrhea, hold the colace until you stool forms back up.  If your symptoms do not get better within 1 week or if they get worse, check with your doctor.  Dulcolax (bisacodyl) - Pick up over-the-counter and take as directed by the product packaging as needed to assist with the movement of your bowels.  Take with a full glass of water.  Use this product as needed if not relieved by Colace only.   MiraLax (polyethylene glycol) - Pick up over-the-counter to have on hand.  MiraLax is a solution that will increase the amount of water in your bowels to assist with bowel movements.  Take as directed and can mix with a glass of water, juice, soda, coffee, or tea.  Take if you go more than two days without a movement. Do not use MiraLax more than once per day. Call your doctor if you are still constipated or irregular after using this medication for 7 days in a row.  If you continue to have problems with postoperative constipation, please contact the office for further assistance and recommendations.  If you experience "the worst abdominal pain ever" or develop nausea or vomiting, please contact the office immediatly for further recommendations for treatment.  Maintain ice and elevation when not  up ambulating to help with swelling in the knee and leg.       Do not put a pillow under the knee. Place it under the heel.    Complete by:  As directed      Do not sit on low chairs, stoools or toilet seats, as it may be difficult to get up from low surfaces    Complete by:  As directed      Driving restrictions    Complete by:  As directed   No driving until released by the physician.     Increase activity slowly as tolerated    Complete by:  As directed      Lifting restrictions    Complete by:  As directed   No lifting until released by the physician.     Patient may shower    Complete by:  As directed   You may shower without a dressing once there is no drainage.  Do not wash over the wound.  If drainage remains, do not shower until drainage stops.     TED hose    Complete by:  As directed   Use stockings (TED hose) for 3 weeks on both leg(s).  You may remove them at night for sleeping.     Weight bearing as tolerated    Complete by:  As directed   Laterality:  left  Extremity:  Lower            Medication List    STOP taking these medications        Co Q-10 200 MG Caps     multivitamin tablet     multivitamin with minerals Tabs tablet     naproxen 500 MG tablet  Commonly known as:  NAPROSYN     OMEGA-3 KRILL OIL PO     OSTEO ADVANCE PO     vitamin C 500 MG tablet  Commonly known as:  ASCORBIC ACID     vitamin E 400 UNIT capsule      TAKE these medications        acyclovir 400 MG tablet  Commonly known as:  ZOVIRAX  Take 1 tablet (400 mg  total) by mouth 2 (two) times daily.     allopurinol 300 MG tablet  Commonly known as:  ZYLOPRIM  Take 1 tablet (300 mg total) by mouth daily.     ALPRAZolam 0.5 MG tablet  Commonly known as:  XANAX  Take 1 tablet (0.5 mg total) by mouth 3 (three) times daily as needed for sleep.     amLODipine 10 MG tablet  Commonly known as:  NORVASC  Take 1 tablet (10 mg total) by mouth daily.     benazepril 40 MG tablet    Commonly known as:  LOTENSIN  Take 1 tablet (40 mg total) by mouth daily.     fexofenadine 60 MG tablet  Commonly known as:  ALLEGRA  Take 3 tablets (180 mg total) by mouth daily.     fluticasone 50 MCG/ACT nasal spray  Commonly known as:  FLONASE  Place 2 sprays into the nose as directed.     iron polysaccharides 150 MG capsule  Commonly known as:  NIFEREX  Take 1 capsule (150 mg total) by mouth 2 (two) times daily.     methocarbamol 500 MG tablet  Commonly known as:  ROBAXIN  Take 1 tablet (500 mg total) by mouth every 6 (six) hours as needed for muscle spasms.     oxyCODONE 5 MG immediate release tablet  Commonly known as:  Oxy IR/ROXICODONE  Take 1-2 tablets (5-10 mg total) by mouth every 3 (three) hours as needed for moderate pain or severe pain.     rivaroxaban 10 MG Tabs tablet  Commonly known as:  XARELTO  Take 1 tablet (10 mg total) by mouth daily with breakfast. Take Xarelto for two and a half more weeks, then discontinue Xarelto. Once the patient has completed the blood thinner regimen, then take a Baby 81 mg Aspirin daily for three more weeks.     simvastatin 20 MG tablet  Commonly known as:  ZOCOR  Take 1 tablet (20 mg total) by mouth at bedtime.     traMADol 50 MG tablet  Commonly known as:  ULTRAM  Take 1-2 tablets (50-100 mg total) by mouth every 6 (six) hours as needed (mild to moderate pain).       Follow-up Information    Follow up with [name], MD. Schedule an appointment as soon as possible for a visit on 07/15/2015.   Specialty:  Orthopedic Surgery   Why:  Call office ASAP at [phone] to setup appointment on Tuesday 07/15/2015 as requested by Dr. [name].   Contact information:   [address] [place] [address] [phone]       Follow up with [facility].   Why:  home health physical therapy   Contact information:   [place] [address] [phone]       Follow up with [facility].   Why:  rolling walker and 3n1 (over the commode seat)   Contact information:   [place] [address] [phone]       Signed: Arlee Muslim, PA-C Orthopaedic Surgery 07/10/2015, 8:05 AM

## 2015-07-08 NOTE — Evaluation (Signed)
Physical Therapy Evaluation Patient Details Name: Shaun Carey MRN: [id number] DOB: 06/15/1941 Today's Date: 07/08/2015   History of Present Illness  L TKA, h/o gout, "balance problem" (pt reports due to leg length discrepancy), LBP  Clinical Impression  Pt is s/p TKA resulting in the deficits listed below (see PT Problem List). Pt ambulated 50' with RW and min/guard assist, performed L TKA exercises with min A. Good progress expected.  Pt will benefit from skilled PT to increase their independence and safety with mobility to allow discharge to the venue listed below.      Follow Up Recommendations Home health PT    Equipment Recommendations  Rolling walker with 5" wheels;3in1 (PT)    Recommendations for Other Services       Precautions / Restrictions Precautions Precautions: Knee, fall (wife reports pt's had 2 falls in past year) Restrictions Other Position/Activity Restrictions: WBAT      Mobility  Bed Mobility Overal bed mobility: Needs Assistance Bed Mobility: Supine to Sit     Supine to sit: Min assist     General bed mobility comments: min A to support LLE, HOB 20*  Transfers Overall transfer level: Needs assistance Equipment used: Rolling walker (2 wheeled) Transfers: Sit to/from Stand Sit to Stand: Min guard         General transfer comment: verbal cues for hand placement  Ambulation/Gait Ambulation/Gait assistance: Min guard Ambulation Distance (Feet): 50 Feet Assistive device: Rolling walker (2 wheeled) Gait Pattern/deviations: Step-to pattern;Trunk flexed   Gait velocity interpretation: Below normal speed for age/gender General Gait Details: continuous verbal cues for positioning in RW as pt steps all the way to front of RW, pt not able to stand fully erect due to LBP, trunk and head flexed  Stairs            Wheelchair Mobility    Modified Rankin (Stroke Patients Only)       Balance Overall balance assessment: Needs  assistance   Sitting balance-Leahy Scale: Good     Standing balance support: Bilateral upper extremity supported Standing balance-Leahy Scale: Poor                               Pertinent Vitals/Pain Pain Assessment: 0-10 Pain Score: 7  Pain Location: L knee Pain Descriptors / Indicators: Sore Pain Intervention(s): Premedicated before session;Monitored during session;Limited activity within patient's tolerance;Ice applied    Home Living Family/patient expects to be discharged to:: Private residence Living Arrangements: Alone Available Help at Discharge: Family;Available 24 hours/day Type of Home: House Home Access: Stairs to enter Entrance Stairs-Rails: None Entrance Stairs-Number of Steps: 2 Home Layout: One level Home Equipment: None      Prior Function Level of Independence: Independent         Comments: standing tolerance limited to 3-4 minutes due to LBP, he has not had back worked up     [employer]        Extremity/Trunk Assessment   Upper Extremity Assessment: Overall WFL for tasks assessed           Lower Extremity Assessment: LLE deficits/detail   LLE Deficits / Details: 0-40* AAROM L knee, SLR -3/5, ankle WNL  Cervical / Trunk Assessment: Normal  Communication   Communication: No difficulties  Cognition Arousal/Alertness: Awake/alert Behavior During Therapy: WFL for tasks assessed/performed Overall Cognitive Status: Within Functional Limits for tasks assessed  General Comments      Exercises Total Joint Exercises Ankle Circles/Pumps: AROM;Both;10 reps;Supine Quad Sets: AROM;Both;10 reps;Supine Short Arc Quad: AAROM;Left;10 reps;Supine Heel Slides: AAROM;Left;10 reps;Supine Hip ABduction/ADduction: AAROM;Left;10 reps;Supine Straight Leg Raises: AAROM;Left;5 reps;Supine Goniometric ROM: 0-40* L knee AAROM      Assessment/Plan    PT Assessment Patient needs continued PT services  PT  Diagnosis Difficulty walking;Acute pain   PT Problem List Decreased strength;Decreased range of motion;Decreased activity tolerance;Decreased balance;Pain;Decreased knowledge of use of DME;Decreased mobility  PT Treatment Interventions DME instruction;Gait training;Stair training;Functional mobility training;Therapeutic activities;Patient/family education;Therapeutic exercise   PT Goals (Current goals can be found in the Care Plan section) Acute Rehab PT Goals Patient Stated Goal: to recover as quickly as I can PT Goal Formulation: With patient/family Time For Goal Achievement: 07/15/15 Potential to Achieve Goals: Good    Frequency 7X/week   Barriers to discharge        Co-evaluation               End of Session Equipment Utilized During Treatment: Gait belt;Left knee immobilizer Activity Tolerance: Patient tolerated treatment well Patient left: in chair;with call bell/phone within reach;with family/visitor present Nurse Communication: Mobility status         Time: [id number] PT Time Calculation (min) (ACUTE ONLY): 28 min   Charges:   PT Evaluation $PT Eval Low Complexity: 1 Procedure PT Treatments $Gait Training: 8-22 mins   PT G Codes:        Philomena Doheny 07/08/2015, 11:43 AM [phone]

## 2015-07-09 LAB — BASIC METABOLIC PANEL
Anion gap: 11 (ref 5–15)
BUN: 9 mg/dL (ref 6–20)
CHLORIDE: 102 mmol/L (ref 101–111)
CO2: 27 mmol/L (ref 22–32)
Calcium: 8.9 mg/dL (ref 8.9–10.3)
Creatinine, Ser: 0.65 mg/dL (ref 0.61–1.24)
Glucose, Bld: 102 mg/dL — ABNORMAL HIGH (ref 65–99)
POTASSIUM: 3.6 mmol/L (ref 3.5–5.1)
SODIUM: 140 mmol/L (ref 135–145)

## 2015-07-09 LAB — CBC
HCT: 26.3 % — ABNORMAL LOW (ref 39.0–52.0)
HEMOGLOBIN: 8.8 g/dL — AB (ref 13.0–17.0)
MCH: 30.7 pg (ref 26.0–34.0)
MCHC: 33.5 g/dL (ref 30.0–36.0)
MCV: 91.6 fL (ref 78.0–100.0)
PLATELETS: 159 10*3/uL (ref 150–400)
RBC: 2.87 MIL/uL — AB (ref 4.22–5.81)
RDW: 12.8 % (ref 11.5–15.5)
WBC: 11.6 10*3/uL — AB (ref 4.0–10.5)

## 2015-07-09 MED ORDER — ALPRAZOLAM 0.5 MG PO TABS
0.5000 mg | ORAL_TABLET | Freq: Three times a day (TID) | ORAL | Status: DC | PRN
  Administered 2015-07-09: 0.5 mg via ORAL
  Filled 2015-07-09: qty 1

## 2015-07-09 MED ORDER — IBUPROFEN 600 MG PO TABS
600.0000 mg | ORAL_TABLET | Freq: Once | ORAL | Status: AC
Start: 2015-07-09 — End: 2015-07-09
  Administered 2015-07-09: 600 mg via ORAL
  Filled 2015-07-09: qty 1

## 2015-07-09 NOTE — Progress Notes (Signed)
Physical Therapy Treatment Patient Details Name: Shaun Carey MRN: [id number] DOB: May 11, 1941 Today's Date: 07/09/2015    History of Present Illness L TKA, h/o gout, "balance problem" (pt reports due to leg length discrepancy), LBP    PT Comments    POD # 2 pm session. Noted increased fluid collection in blisters.  Pain same as this am.  Applied KI with ABD pad at site.  Tolerated amb in hallway with less tremors. Tolerated increased distance.  Pt ready for D/C to home but daughter has many concerns.    Follow Up Recommendations  Home health PT     Equipment Recommendations  Rolling walker with 5" wheels;3in1 (PT)    Recommendations for Other Services       Precautions / Restrictions Precautions Precautions: Knee;Fall Precaution Comments: instructed on use for KI Restrictions Weight Bearing Restrictions: No Other Position/Activity Restrictions: WBAT    Mobility  Bed Mobility Overal bed mobility: Needs Assistance Bed Mobility: Supine to Sit     Supine to sit: Min assist     General bed mobility comments: assist with L leg only.  Transfers Overall transfer level: Needs assistance Equipment used: Rolling walker (2 wheeled) Transfers: Sit to/from Stand Sit to Stand: Supervision         General transfer comment: verbal cues for hand placement.  Min unsteady.    Ambulation/Gait Ambulation/Gait assistance: Min guard Ambulation Distance (Feet): 155 Feet Assistive device: Rolling walker (2 wheeled) Gait Pattern/deviations: Step-to pattern;Decreased stance time - left;Trunk flexed Gait velocity: decreased   General Gait Details: VC's for proper sequencing and upright posture.    Stairs  Wheelchair Mobility    Modified Rankin (Stroke Patients Only)       Balance                                    Cognition Arousal/Alertness: Awake/alert Behavior During Therapy: WFL for tasks assessed/performed Overall Cognitive Status: Within  Functional Limits for tasks assessed                      Exercises      General Comments        Pertinent Vitals/Pain Pain Assessment: 0-10 Pain Score: 4  Pain Location: L knee Pain Descriptors / Indicators: Sore;Tender Pain Intervention(s): Monitored during session;Premedicated before session;Repositioned;Ice applied    Home Living                      Prior Function            PT Goals (current goals can now be found in the care plan section) Progress towards PT goals: Progressing toward goals    Frequency  7X/week    PT Plan Current plan remains appropriate    Co-evaluation             End of Session Equipment Utilized During Treatment: Gait belt;Left knee immobilizer Activity Tolerance: Patient tolerated treatment well Patient left: with call bell/phone within reach;with family/visitor present;in bed     Time: [id number] PT Time Calculation (min) (ACUTE ONLY): 40 min  Charges:  $Gait Training: 8-22 mins $Therapeutic Exercise:8 - 22 mins $Therapeutic Activity: 8-22 mins                    G Codes:      Rica Koyanagi  PTA [facility]  Acute  Rehab Pager  [phone]  

## 2015-07-09 NOTE — Progress Notes (Signed)
Occupational Therapy Treatment Patient Details Name: Shaun Carey MRN: [id number] DOB: 1941-04-19 Today's Date: 07/09/2015    History of present illness L TKA, h/o gout, "balance problem" (pt reports due to leg length discrepancy), LBP   OT comments  Pt making very slow progress.  Pt anxious today about several things including drainage around wound, blood thinner medication schedule and physical therapy.  Pt was shaking on arrival and resistant to getting up.  Did talk pt into getting up to EOB and doing LE adls but he did not want to walk to bathroom to do shower transfers.  Pt did well on EOB and doing LE dressing in standing.  Cont to require S to min assist with adls but will have daughter there to assist when he returns home.  Follow Up Recommendations  No OT follow up;Supervision - Intermittent;Other (comment)    Equipment Recommendations  3 in 1 bedside comode    Recommendations for Other Services      Precautions / Restrictions Precautions Precautions: Knee;Fall Precaution Comments: Pt with fair amount of yellow drainage from knee.  Nursing aware. Restrictions Weight Bearing Restrictions: No Other Position/Activity Restrictions: WBAT       Mobility Bed Mobility Overal bed mobility: Needs Assistance Bed Mobility: Supine to Sit;Sit to Supine     Supine to sit: Min assist Sit to supine: Min assist   General bed mobility comments: assist with L leg only.  Transfers Overall transfer level: Needs assistance Equipment used: Rolling walker (2 wheeled) Transfers: Sit to/from Stand Sit to Stand: Supervision         General transfer comment: verbal cues for hand placement    Balance Overall balance assessment: Needs assistance Sitting-balance support: Feet supported Sitting balance-Leahy Scale: Good     Standing balance support: Bilateral upper extremity supported;During functional activity Standing balance-Leahy Scale: Poor Standing balance comment: Pt  must have walker when standing.                   ADL Overall ADL's : Needs assistance/impaired     Grooming: Supervision/safety;Standing       Lower Body Bathing: Min guard;Sit to/from stand       Lower Body Dressing: Minimal assistance;Sit to/from stand Lower Body Dressing Details (indicate cue type and reason): Pt unable to donn L sock at this time.  Discussed sock aid with him but will wait to see how he does. Toilet Transfer: Min guard;Ambulation;BSC;RW   Toileting- Clothing Manipulation and Hygiene: Set up;Sitting/lateral lean   Tub/ Shower Transfer: Min guard;Walk-in shower;Ambulation;3 in 1;Rolling walker   Functional mobility during ADLs: Min guard;Rolling walker General ADL Comments: Pt very shaky and not feeling well after PT session.  Pt and girlfriend with a lot of complaints re: blood thinner management, confusion on sequencing walking with walker and drainage around his wound site(nursing notified).  Therefore pt not up for much mobility.  Pt educated re LE dressing techniques and only needs assist to donn L sock and shoe.  Pt requires reminders for hand placement each time he stands.      Vision                     Perception     Praxis      Cognition   Behavior During Therapy: [facility] for tasks assessed/performed Overall Cognitive Status: Within Functional Limits for tasks assessed  Extremity/Trunk Assessment               Exercises     Shoulder Instructions       General Comments      Pertinent Vitals/ Pain       Pain Assessment: 0-10 Pain Score: 6  Pain Location: L knee Pain Descriptors / Indicators: Tightness;Sore Pain Intervention(s): Limited activity within patient's tolerance;Monitored during session;Repositioned;Ice applied  Home Living                                          Prior Functioning/Environment              Frequency Min 2X/week     Progress Toward  Goals  OT Goals(current goals can now be found in the care plan section)  Progress towards OT goals: Not progressing toward goals - comment (Pt anxious today about wound and medications)  Acute Rehab OT Goals Patient Stated Goal: to go deer hunting and fishing OT Goal Formulation: With patient Time For Goal Achievement: 07/15/15 Potential to Achieve Goals: Good ADL Goals Pt Will Perform Lower Body Bathing: with modified independence;with adaptive equipment;sit to/from stand Pt Will Perform Lower Body Dressing: with modified independence;with adaptive equipment;sit to/from stand Pt Will Transfer to Toilet: with modified independence;ambulating;bedside commode Pt Will Perform Tub/Shower Transfer: Shower transfer;with modified independence;ambulating;3 in 1;rolling walker  Plan Discharge plan remains appropriate    Co-evaluation                 End of Session Equipment Utilized During Treatment: Gait belt;Rolling walker   Activity Tolerance Patient limited by pain   Patient Left in bed;with call bell/phone within reach   Nurse Communication Mobility status;Other (comment) (Wound draining)        Time: [id number] OT Time Calculation (min): 30 min  Charges: OT General Charges $OT Visit: 1 Procedure OT Treatments $Self Care/Home Management : 23-37 mins  Glenford Peers 07/09/2015, 11:57 AM  [phone]

## 2015-07-09 NOTE — Progress Notes (Signed)
Physical Therapy Treatment Patient Details Name: Shaun Carey MRN: [id number] DOB: 1940-12-21 Today's Date: 07/09/2015    History of Present Illness L TKA, h/o gout, "balance problem" (pt reports due to leg length discrepancy), LBP    PT Comments    POD # 2 am session.  Applied and instructed on KI use for amb as pt was unable to perform active SLR.  Assisted OOB with increased time and assist L LE.  Assisted with amb a greater distance and practiced 2 steps backward with walker due to no rails.  Returned to room then performed TKR TE's.  Pt slightly "cognition impaired" required repeat instruction.  Follow Up Recommendations  Home health PT     Equipment Recommendations  Rolling walker with 5" wheels;3in1 (PT)    Recommendations for Other Services       Precautions / Restrictions Precautions Precautions: Knee;Fall Precaution Comments: instructed on use for KI Restrictions Weight Bearing Restrictions: No Other Position/Activity Restrictions: WBAT    Mobility  Bed Mobility Overal bed mobility: Needs Assistance Bed Mobility: Supine to Sit     Supine to sit: Min assist     General bed mobility comments: assist with L leg only.  Transfers Overall transfer level: Needs assistance Equipment used: Rolling walker (2 wheeled) Transfers: Sit to/from Stand Sit to Stand: Supervision         General transfer comment: verbal cues for hand placement.  Min unsteady.    Ambulation/Gait Ambulation/Gait assistance: Min guard Ambulation Distance (Feet): 140 Feet Assistive device: Rolling walker (2 wheeled) Gait Pattern/deviations: Step-to pattern;Decreased stance time - left;Trunk flexed Gait velocity: decreased   General Gait Details: VC's for proper sequencing and upright posture.  Tremors throughout.     Stairs Stairs: Yes Stairs assistance: Min guard Stair Management: No rails;Backwards;With walker Number of Stairs: 2 General stair comments: performed with  "good friend" neighbor twice.    Wheelchair Mobility    Modified Rankin (Stroke Patients Only)       Balance                                    Cognition Arousal/Alertness: Awake/alert Behavior During Therapy: WFL for tasks assessed/performed Overall Cognitive Status: Within Functional Limits for tasks assessed                      Exercises   Total Knee Replacement TE's 10 reps B LE ankle pumps 10 reps towel squeezes 10 reps knee presses 10 reps heel slides  10 reps SAQ's 10 reps SLR's 10 reps ABD Followed by ICE     General Comments        Pertinent Vitals/Pain Pain Assessment: 0-10 Pain Score: 4  Pain Location: L knee Pain Descriptors / Indicators: Sore;Tender Pain Intervention(s): Monitored during session;Premedicated before session;Repositioned;Ice applied    Home Living                      Prior Function            PT Goals (current goals can now be found in the care plan section) Progress towards PT goals: Progressing toward goals    Frequency  7X/week    PT Plan Current plan remains appropriate    Co-evaluation             End of Session Equipment Utilized During Treatment: Gait belt;Left knee immobilizer Activity Tolerance: Patient tolerated treatment  well Patient left: with call bell/phone within reach;with family/visitor present;in bed     Time: 0950-1016 PT Time Calculation (min) (ACUTE ONLY): 26 min  Charges:  $Gait Training: 8-22 mins $Therapeutic Exercise: 8-22 mins                    G Codes:      Rica Koyanagi  PTA [facility]  Acute  Rehab Pager      [phone]

## 2015-07-10 ENCOUNTER — Inpatient Hospital Stay (HOSPITAL_COMMUNITY): Payer: Medicare Other

## 2015-07-10 LAB — CBC
HEMATOCRIT: 22.9 % — AB (ref 39.0–52.0)
HEMOGLOBIN: 7.6 g/dL — AB (ref 13.0–17.0)
MCH: 30.4 pg (ref 26.0–34.0)
MCHC: 33.2 g/dL (ref 30.0–36.0)
MCV: 91.6 fL (ref 78.0–100.0)
Platelets: 147 10*3/uL — ABNORMAL LOW (ref 150–400)
RBC: 2.5 MIL/uL — AB (ref 4.22–5.81)
RDW: 12.9 % (ref 11.5–15.5)
WBC: 11.8 10*3/uL — AB (ref 4.0–10.5)

## 2015-07-10 MED ORDER — POLYSACCHARIDE IRON COMPLEX 150 MG PO CAPS: 150.0000 mg | ORAL_CAPSULE | Freq: Two times a day (BID) | ORAL | Status: DC

## 2015-07-10 MED ORDER — POLYSACCHARIDE IRON COMPLEX 150 MG PO CAPS
150.0000 mg | ORAL_CAPSULE | Freq: Two times a day (BID) | ORAL | Status: DC
  Administered 2015-07-10: 150 mg via ORAL
  Filled 2015-07-10 (×2): qty 1

## 2015-07-10 NOTE — Progress Notes (Addendum)
LATE ENTRY NOTE Date of Service of Visit - Wednesday 07/09/2015     Subjective: 2 Days Post-Op Procedure(s) (LRB): LEFT TOTAL KNEE ARTHROPLASTY (Left) Patient reports pain as moderate.   Patient seen in rounds for Dr. [name].  Patient very upset about not getting approval for his blood thinner.  Explained to patient that would get the office to obtain authorization.  Encourage patient to use pain meds and xanax due to increased anxiety.  Encouraged ice and elevation. Patient is having problems with anxiety and pain in the knee, requiring pain medications Plan is to go Home after hospital stay.  Objective: Vital signs in last 24 hours: Temp:  [97.6 F (36.4 C)-102.8 F (39.3 C)] 97.6 F (36.4 C) (01/19 0549) Pulse Rate:  [78-123] 78 (01/19 0549) Resp:  [16-18] 16 (01/19 0549) BP: (111-159)/(46-69) 111/61 mmHg (01/19 0549) SpO2:  [92 %-100 %] 96 % (01/19 0549)  Intake/Output from previous day:  Intake/Output Summary (Last 24 hours) at 07/10/15 0807 Last data filed at 07/10/15 0603  Gross per 24 hour  Intake    840 ml  Output   1400 ml  Net   -560 ml     Labs:  Recent Labs  07/08/15 0422 07/09/15 0410   HGB 10.1* 8.8*      Recent Labs  07/08/15 0422 07/09/15 0410  NA 140 140  K 4.2 3.6  CL 107 102  CO2 27 27  BUN 8 9  CREATININE 0.70 0.65  GLUCOSE 168* 102*  CALCIUM 9.2 8.9   No results for input(s): LABPT, INR in the last 72 hours.  EXAM General - Patient is Alert and Appropriate Extremity - Neurovascular intact Sensation intact distally Dorsiflexion/Plantar flexion intact Dressing/Incision - dry, no drainage, blisters noted on the distal 1/3 of the incision (tape blisters secondary to swelling) and also noted over the medial knee. Motor Function - intact, moving foot and toes well on exam.   Past Medical History  Diagnosis Date  . Hypertension   . Hyperlipidemia   . Anxiety   . DJD (degenerative joint disease) of knee   . Gout   . HSV-1  (herpes simplex virus 1) infection   . History of right shoulder fracture   . History of weight change     lost 30 lbs over last 5 years   . Fatigue   . Balance problems   . Back pain     severe    Assessment/Plan: 2 Days Post-Op Procedure(s) (LRB): LEFT TOTAL KNEE ARTHROPLASTY (Left) Principal Problem:   OA (osteoarthritis) of knee  Estimated body mass index is 25.03 kg/(m^2) as calculated from the following:   Height as of this encounter: 5' 11.5" (1.816 m).   Weight as of this encounter: 82.555 kg (182 lb). Up with therapy Discharge home with home health  Awaiting authorization for the blood thinner  DVT Prophylaxis - Xarelto Weight-Bearing as tolerated to left leg  Arlee Muslim, PA-C Orthopaedic Surgery 07/10/2015, 8:07 AM

## 2015-07-10 NOTE — Progress Notes (Signed)
Discharged from floor via w/c, belongings & daughter with pt. No changes in assessment. Shaun Carey  

## 2015-07-10 NOTE — Progress Notes (Addendum)
   Subjective: 3 Days Post-Op Procedure(s) (LRB): LEFT TOTAL KNEE ARTHROPLASTY (Left) Patient reports pain as mild.   Patient seen in rounds by Dr. [name].  Elevated temp last night.  CBC and WBC checked this morning.  HGB down to 7.6 and WBC stable at 11.8 (11.6 yesterday).  Will add Iron supplement.  No antibiotics at this time.  Will recheck CBC on Monday and have patient follow up in office on Tuesday of next week for recheck. CXR - IMPRESSION: No evidence for acute cardiopulmonary abnormality. Patient is well, but has had some minor complaints of pain in the knee, requiring pain medications Patient is ready to go home following therapy.  Objective: Vital signs in last 24 hours: Temp:  [97.6 F (36.4 C)-102.8 F (39.3 C)] 97.6 F (36.4 C) (01/19 0549) Pulse Rate:  [78-123] 78 (01/19 0549) Resp:  [16-18] 16 (01/19 0549) BP: (111-159)/(46-69) 111/61 mmHg (01/19 0549) SpO2:  [92 %-100 %] 96 % (01/19 0549)  Intake/Output from previous day:  Intake/Output Summary (Last 24 hours) at 07/10/15 0753 Last data filed at 07/10/15 0603  Gross per 24 hour  Intake    840 ml  Output   1400 ml  Net   -560 ml    Labs:  Recent Labs  07/08/15 0422 07/09/15 0410 07/10/15 0650  HGB 10.1* 8.8* 7.6*    Recent Labs  07/09/15 0410 07/10/15 0650  WBC 11.6* 11.8*  RBC 2.87* 2.50*  HCT 26.3* 22.9*  PLT 159 147*    Recent Labs  07/08/15 0422 07/09/15 0410  NA 140 140  K 4.2 3.6  CL 107 102  CO2 27 27  BUN 8 9  CREATININE 0.70 0.65  GLUCOSE 168* 102*  CALCIUM 9.2 8.9   No results for input(s): LABPT, INR in the last 72 hours.  EXAM: General - Patient is Alert and Appropriate Extremity - Neurovascular intact Sensation intact distally Dorsiflexion/Plantar flexion intact Incision - no drainage, blisters noted and appear stable.  Tegaderms placed over blisters yesterday.  Leave Tegaderms in place until follow up visit. Motor Function - intact, moving foot and toes well on  exam.   Assessment/Plan: 3 Days Post-Op Procedure(s) (LRB): LEFT TOTAL KNEE ARTHROPLASTY (Left) Procedure(s) (LRB): LEFT TOTAL KNEE ARTHROPLASTY (Left) Past Medical History  Diagnosis Date  . Hypertension   . Hyperlipidemia   . Anxiety   . DJD (degenerative joint disease) of knee   . Gout   . HSV-1 (herpes simplex virus 1) infection   . History of right shoulder fracture   . History of weight change     lost 30 lbs over last 5 years   . Fatigue   . Balance problems   . Back pain     severe   Principal Problem:   OA (osteoarthritis) of knee  Estimated body mass index is 25.03 kg/(m^2) as calculated from the following:   Height as of this encounter: 5' 11.5" (1.816 m).   Weight as of this encounter: 82.555 kg (182 lb). Up with therapy Discharge home with home health Diet - Cardiac diet Follow up - in 2 weeks Activity - WBAT Disposition - Home Condition Upon Discharge - Stable D/C Meds - See DC Summary DVT Prophylaxis - Xarelto  Arlee Muslim, PA-C Orthopaedic Surgery 07/10/2015, 7:53 AM

## 2015-07-10 NOTE — Progress Notes (Signed)
Patient had fever of 102.8, tylenol given, fever reduced to 99.5.  SN paged MD oncall for [facility].  Merla Riches, PA returned call.  SN advised patient had fever earlier today, tylenol given with fever resolved, patient again developed fever, tylenol given which was effective.  Brad ordered chest x-ray for in the morning, ibuprofen po and continue tylenol q 6 hours until morning.

## 2015-07-10 NOTE — Progress Notes (Signed)
Occupational Therapy Treatment Patient Details Name: Shaun Carey MRN: [id number] DOB: 07-Apr-1941 Today's Date: 07/10/2015    History of present illness L TKA, h/o gout, "balance problem" (pt reports due to leg length discrepancy), LBP   OT comments  OT answered many questions regarding ADL activity.  Pt overall S with ADL activity  Follow Up Recommendations  No OT follow up;Supervision - Intermittent;Other (comment)    Equipment Recommendations  3 in 1 bedside comode    Recommendations for Other Services      Precautions / Restrictions Precautions Precautions: Knee;Fall Restrictions Weight Bearing Restrictions: No Other Position/Activity Restrictions: WBAT       Mobility Bed Mobility Overal bed mobility: Needs Assistance Bed Mobility: Supine to Sit     Supine to sit: Supervision        Transfers Overall transfer level: Needs assistance Equipment used: Rolling walker (2 wheeled) Transfers: Sit to/from [facility] Sit to Stand: Supervision Stand pivot transfers: Supervision                ADL Overall ADL's : Needs assistance/impaired                 Upper Body Dressing : Set up;Sitting   Lower Body Dressing: Supervision/safety;Sit to/from stand Lower Body Dressing Details (indicate cue type and reason): pt able to don pants, socks and shoes Toilet Transfer: Supervision/safety;RW Toilet Transfer Details (indicate cue type and reason): urinal Toileting- Clothing Manipulation and Hygiene: Supervision/safety;Sit to/from stand                          Cognition   Behavior During Therapy: [facility] for tasks assessed/performed Overall Cognitive Status: Within Functional Limits for tasks assessed                       Extremity/Trunk Assessment                          Pertinent Vitals/ Pain       Pain Score: 3  Pain Location: l knee Pain Descriptors / Indicators: Sore Pain Intervention(s): Monitored  during session  Home Living                                              Frequency Min 2X/week     Progress Toward Goals  OT Goals(current goals can now be found in the care plan section)  Progress towards OT goals: Progressing toward goals     Plan Discharge plan remains appropriate    Co-evaluation                 End of Session Equipment Utilized During Treatment: Gait belt;Rolling walker   Activity Tolerance Patient limited by pain   Patient Left with call bell/phone within reach;in chair;with family/visitor present   Nurse Communication  (Wound draining)        Time: [id number] OT Time Calculation (min): 31 min  Charges: OT General Charges $OT Visit: 1 Procedure OT Treatments $Self Care/Home Management : 23-37 mins  Orlinda Slomski, Thereasa Parkin 07/10/2015, 12:13 PM

## 2015-07-10 NOTE — Progress Notes (Signed)
Physical Therapy Treatment Patient Details Name: Shaun Carey MRN: [id number] DOB: [birth year] Today's Date: 07/10/2015    History of Present Illness L TKA, h/o gout, "balance problem" (pt reports due to leg length discrepancy), LBP    PT Comments    POD # 3 pt eager to D/C to home Assisted with amb in hallway without [place].  Practiced stairs twice with daughter.  Assisted to and from bathroom with instruction on walker safety and negotiating thru tight spaces.  All mobility questions addressed.  Pt ready for D/C to home.    Follow Up Recommendations  Home health PT     Equipment Recommendations  Rolling walker with 5" wheels;3in1 (PT)    Recommendations for Other Services       Precautions / Restrictions Precautions Precautions: Knee;Fall Precaution Comments: able to perform active SLR so D/C KI Restrictions Weight Bearing Restrictions: No Other Position/Activity Restrictions: WBAT    Mobility  Bed Mobility Overal bed mobility: Needs Assistance Bed Mobility: Supine to Sit     Supine to sit: Supervision     General bed mobility comments: Pt OOB in recliner  Transfers Overall transfer level: Needs assistance Equipment used: Rolling walker (2 wheeled) Transfers: Sit to/from Stand Sit to Stand: Supervision Stand pivot transfers: Supervision       General transfer comment: one VC on L LE extension prior to sit  Ambulation/Gait Ambulation/Gait assistance: Supervision Ambulation Distance (Feet): 95 Feet Assistive device: Rolling walker (2 wheeled) Gait Pattern/deviations: Step-to pattern;Decreased stance time - left Gait velocity: decreased   General Gait Details: increased time and one VC safety with backward gait   Stairs Stairs: Yes Stairs assistance: Min assist Stair Management: No rails;Step to pattern;Backwards;With walker Number of Stairs: 2 General stair comments: with daughter and 50% VC's on proper tech and walker placement.  Performed twice  plus handout given   Wheelchair Mobility    Modified Rankin (Stroke Patients Only)       Balance                                    Cognition Arousal/Alertness: Awake/alert Behavior During Therapy: WFL for tasks assessed/performed Overall Cognitive Status: Within Functional Limits for tasks assessed                      Exercises      General Comments        Pertinent Vitals/Pain Pain Assessment: 0-10 Pain Score: 3  Pain Location: L knee Pain Descriptors / Indicators: Sore;Tender Pain Intervention(s): Monitored during session;Premedicated before session;Repositioned    Home Living                      Prior Function            PT Goals (current goals can now be found in the care plan section) Progress towards PT goals: Progressing toward goals    Frequency  7X/week    PT Plan Current plan remains appropriate    Co-evaluation             End of Session Equipment Utilized During Treatment: Gait belt Activity Tolerance: Patient tolerated treatment well Patient left: in chair;with call bell/phone within reach     Time: 1235-1250 PT Time Calculation (min) (ACUTE ONLY): 15 min  Charges:  $Gait Training: 8-22 mins  G Codes:      Rica Koyanagi  PTA [facility]  Acute  Rehab Pager      [phone]

## 2015-07-11 ENCOUNTER — Emergency Department (HOSPITAL_COMMUNITY): Payer: Medicare Other

## 2015-07-11 ENCOUNTER — Observation Stay (HOSPITAL_COMMUNITY)
Admission: EM | Admit: 2015-07-11 | Discharge: 2015-07-14 | Disposition: A | Discharge status: Home / Self Care | Payer: Medicare Other | Attending: Family Medicine | Admitting: Family Medicine

## 2015-07-11 ENCOUNTER — Encounter (HOSPITAL_COMMUNITY): Payer: Self-pay | Admitting: Emergency Medicine

## 2015-07-11 DIAGNOSIS — D62 Acute posthemorrhagic anemia: Secondary | ICD-10-CM | POA: Insufficient documentation

## 2015-07-11 DIAGNOSIS — E785 Hyperlipidemia, unspecified: Secondary | ICD-10-CM | POA: Diagnosis not present

## 2015-07-11 DIAGNOSIS — I493 Ventricular premature depolarization: Secondary | ICD-10-CM | POA: Insufficient documentation

## 2015-07-11 DIAGNOSIS — I10 Essential (primary) hypertension: Secondary | ICD-10-CM | POA: Diagnosis not present

## 2015-07-11 DIAGNOSIS — F419 Anxiety disorder, unspecified: Secondary | ICD-10-CM | POA: Insufficient documentation

## 2015-07-11 DIAGNOSIS — R509 Fever, unspecified: Secondary | ICD-10-CM | POA: Diagnosis not present

## 2015-07-11 DIAGNOSIS — Z96652 Presence of left artificial knee joint: Secondary | ICD-10-CM | POA: Insufficient documentation

## 2015-07-11 DIAGNOSIS — Z8614 Personal history of Methicillin resistant Staphylococcus aureus infection: Secondary | ICD-10-CM | POA: Insufficient documentation

## 2015-07-11 DIAGNOSIS — B009 Herpesviral infection, unspecified: Secondary | ICD-10-CM | POA: Diagnosis not present

## 2015-07-11 DIAGNOSIS — Z7901 Long term (current) use of anticoagulants: Secondary | ICD-10-CM | POA: Insufficient documentation

## 2015-07-11 DIAGNOSIS — M1990 Unspecified osteoarthritis, unspecified site: Secondary | ICD-10-CM | POA: Diagnosis not present

## 2015-07-11 LAB — COMPREHENSIVE METABOLIC PANEL
ALT: 22 U/L (ref 17–63)
AST: 30 U/L (ref 15–41)
Albumin: 2.7 g/dL — ABNORMAL LOW (ref 3.5–5.0)
Alkaline Phosphatase: 72 U/L (ref 38–126)
Anion gap: 9 (ref 5–15)
BILIRUBIN TOTAL: 1 mg/dL (ref 0.3–1.2)
BUN: 7 mg/dL (ref 6–20)
CHLORIDE: 101 mmol/L (ref 101–111)
CO2: 26 mmol/L (ref 22–32)
Calcium: 8.5 mg/dL — ABNORMAL LOW (ref 8.9–10.3)
Creatinine, Ser: 0.78 mg/dL (ref 0.61–1.24)
Glucose, Bld: 133 mg/dL — ABNORMAL HIGH (ref 65–99)
POTASSIUM: 3.6 mmol/L (ref 3.5–5.1)
Sodium: 136 mmol/L (ref 135–145)
TOTAL PROTEIN: 6.2 g/dL — AB (ref 6.5–8.1)

## 2015-07-11 LAB — CBC WITH DIFFERENTIAL/PLATELET
BASOS ABS: 0 10*3/uL (ref 0.0–0.1)
Basophils Relative: 0 %
EOS PCT: 1 %
Eosinophils Absolute: 0.1 10*3/uL (ref 0.0–0.7)
HEMATOCRIT: 20.1 % — AB (ref 39.0–52.0)
Hemoglobin: 7 g/dL — ABNORMAL LOW (ref 13.0–17.0)
LYMPHS ABS: 1.6 10*3/uL (ref 0.7–4.0)
LYMPHS PCT: 16 %
MCH: 31 pg (ref 26.0–34.0)
MCHC: 34.8 g/dL (ref 30.0–36.0)
MCV: 88.9 fL (ref 78.0–100.0)
MONO ABS: 1.5 10*3/uL — AB (ref 0.1–1.0)
MONOS PCT: 15 %
NEUTROS ABS: 6.8 10*3/uL (ref 1.7–7.7)
Neutrophils Relative %: 68 %
PLATELETS: 216 10*3/uL (ref 150–400)
RBC: 2.26 MIL/uL — ABNORMAL LOW (ref 4.22–5.81)
RDW: 12.7 % (ref 11.5–15.5)
WBC: 10 10*3/uL (ref 4.0–10.5)

## 2015-07-11 LAB — I-STAT CG4 LACTIC ACID, ED: LACTIC ACID, VENOUS: 1.51 mmol/L (ref 0.5–2.0)

## 2015-07-11 MED ORDER — ACETAMINOPHEN 325 MG PO TABS: 650.0000 mg | ORAL_TABLET | Freq: Once | ORAL | Status: DC

## 2015-07-11 MED ORDER — SODIUM CHLORIDE 0.9 % IV BOLUS (SEPSIS)
1000.0000 mL | Freq: Once | INTRAVENOUS | Status: AC
  Administered 2015-07-11: 1000 mL via INTRAVENOUS

## 2015-07-11 NOTE — ED Notes (Signed)
Pt has had 3000 mg of Tylenol in the past 24 hours per family

## 2015-07-11 NOTE — ED Provider Notes (Signed)
CSN: [id number]     Arrival date & time 07/11/15  2038 History   First MD Initiated Contact with Patient 07/11/15 2100     Chief Complaint  Patient presents with  . Post-op Problem     (Consider location/radiation/quality/duration/timing/severity/associated sxs/prior Treatment) HPI 75 year old male who presents with fever. He has a history of hypertension, hyperlipidemia, degenerative joint disease status post left knee arthroplasty on 07/07/2015 by Dr. [name]. Patient's family states that he has been having persistent fevers since has surgery. Was discharged one day ago, and states that at home he continues to have fever up to 102 Fahrenheit area did states that he seems to have increased warmth and swelling to the left leg. They have also noted blistering with drainage of clear liquid and blood. Has been on Xarelto through this time, and he denies any calf tenderness, shortness of breath or chest pain. He has not had any nausea, vomiting, abdominal pain, dysuria or urinary frequency, cough, sputum, or other upper respiratory symptoms. States that he was evaluated by their orthopedic surgeon multiple times during admission for this fever, and it was felt overall to be due to postoperative fever.   Past Medical History  Diagnosis Date  . Hypertension   . Hyperlipidemia   . Anxiety   . DJD (degenerative joint disease) of knee   . Gout   . HSV-1 (herpes simplex virus 1) infection   . History of right shoulder fracture   . History of weight change     lost 30 lbs over last 5 years   . Fatigue   . Balance problems   . Back pain     severe   Past Surgical History  Procedure Laterality Date  . Vasectomy  '86  . Tonsillectomy and adenoidectomy    . Tonsillectomy    . Naval hernia correction       Dr Lucia Bitter approx 10 years ago   . Total knee arthroplasty Left 07/07/2015    Procedure: LEFT TOTAL KNEE ARTHROPLASTY;  Surgeon: Gaynelle Arabian, MD;  Location: [facility];  Service: Orthopedics;   Laterality: Left;   Family History  Problem Relation Age of Onset  . Alzheimer's disease Mother   . Glaucoma Mother   . Cancer Neg Hx   . Diabetes Neg Hx   . Heart disease Brother     murmur   Social History  Substance Use Topics  . Smoking status: Never Smoker   . Smokeless tobacco: Never Used  . Alcohol Use: 1.0 oz/week    2 Standard drinks or equivalent per week     Comment: 1 beer daily     Review of Systems 10/14 systems reviewed and are negative other than those stated in the HPI    Allergies  Review of patient's allergies indicates no known allergies.  Home Medications   Prior to Admission medications   Medication Sig Start Date End Date Taking? Authorizing Provider  acetaminophen (TYLENOL) 500 MG tablet Take 1,000 mg by mouth every 6 (six) hours as needed for fever.   Yes Historical Provider, MD  acyclovir (ZOVIRAX) 400 MG tablet Take 1 tablet (400 mg total) by mouth 2 (two) times daily. 12/28/12  Yes Neena Rhymes, MD  allopurinol (ZYLOPRIM) 300 MG tablet Take 1 tablet (300 mg total) by mouth daily. 12/28/12  Yes Neena Rhymes, MD  ALPRAZolam Duanne Moron) 0.5 MG tablet Take 1 tablet (0.5 mg total) by mouth 3 (three) times daily as needed for sleep. 12/28/12  Yes Heinz Knuckles  Norins, MD  amLODipine (NORVASC) 10 MG tablet Take 1 tablet (10 mg total) by mouth daily. Patient taking differently: Take 5 mg by mouth daily.  12/28/12  Yes Neena Rhymes, MD  benazepril (LOTENSIN) 40 MG tablet Take 1 tablet (40 mg total) by mouth daily. 12/28/12  Yes Neena Rhymes, MD  Coenzyme Q10 (COQ10 PO) Take 1 tablet by mouth daily.   Yes Historical Provider, MD  docusate sodium (COLACE) 100 MG capsule Take 100 mg by mouth daily as needed for mild constipation.   Yes Historical Provider, MD  fluticasone (FLONASE) 50 MCG/ACT nasal spray Place 2 sprays into the nose as directed. Patient taking differently: Place 2 sprays into the nose daily as needed for allergies or rhinitis.  12/28/12   Yes Neena Rhymes, MD  methocarbamol (ROBAXIN) 500 MG tablet Take 1 tablet (500 mg total) by mouth every 6 (six) hours as needed for muscle spasms. 07/08/15  Yes Arlee Muslim, PA-C  oxyCODONE (OXY IR/ROXICODONE) 5 MG immediate release tablet Take 1-2 tablets (5-10 mg total) by mouth every 3 (three) hours as needed for moderate pain or severe pain. 07/08/15  Yes Arlee Muslim, PA-C  rivaroxaban (XARELTO) 10 MG TABS tablet Take 1 tablet (10 mg total) by mouth daily with breakfast. Take Xarelto for two and a half more weeks, then discontinue Xarelto. Once the patient has completed the blood thinner regimen, then take a Baby 81 mg Aspirin daily for three more weeks. 07/08/15  Yes Arlee Muslim, PA-C  simvastatin (ZOCOR) 20 MG tablet Take 1 tablet (20 mg total) by mouth at bedtime. 12/28/12  Yes Neena Rhymes, MD  iron polysaccharides (NIFEREX) 150 MG capsule Take 1 capsule (150 mg total) by mouth 2 (two) times daily. 07/10/15   Arlee Muslim, PA-C  traMADol (ULTRAM) 50 MG tablet Take 1-2 tablets (50-100 mg total) by mouth every 6 (six) hours as needed (mild to moderate pain). 07/08/15   Arlee Muslim, PA-C   BP 113/62 mmHg  Pulse 89  Temp(Src) 101.9 F (38.8 C) (Oral)  Resp 25  SpO2 94% Physical Exam Physical Exam  Nursing note and vitals reviewed. Constitutional: Elderly appearing man, well developed, well nourished, non-toxic, and in no acute distress Head: Normocephalic and atraumatic.  Mouth/Throat: Oropharynx is clear and moist.  Neck: Normal range of motion. Neck supple.  Cardiovascular: Tachycardic rate and regular rhythm.  +2 DP pulses Pulmonary/Chest: Effort normal and breath sounds normal.  Abdominal: Soft. There is no tenderness. There is no rebound and no guarding.  Musculoskeletal: Edema to the left leg with warm and minimal erythema. No calf tenderness. Vertical surgical incision over knee is clean, dry and in tact. With hemorrhagic blisters surrounding. No purulent  drainage. Neurological: Alert, no facial droop, fluent speech, moves all extremities symmetrically Skin: Skin is warm and dry.  Psychiatric: Cooperative  ED Course  Procedures (including critical care time) Labs Review Labs Reviewed  CBC WITH DIFFERENTIAL/PLATELET - Abnormal; Notable for the following:    RBC 2.26 (*)    Hemoglobin 7.0 (*)    HCT 20.1 (*)    Monocytes Absolute 1.5 (*)    All other components within normal limits  COMPREHENSIVE METABOLIC PANEL - Abnormal; Notable for the following:    Glucose, Bld 133 (*)    Calcium 8.5 (*)    Total Protein 6.2 (*)    Albumin 2.7 (*)    All other components within normal limits  CULTURE, BLOOD (ROUTINE X 2)  CULTURE, BLOOD (ROUTINE X 2)  URINALYSIS, ROUTINE W REFLEX MICROSCOPIC (NOT AT [facility])  I-STAT CG4 LACTIC ACID, ED  I-STAT CG4 LACTIC ACID, ED    Imaging Review Dg Chest 1 View  07/10/2015  CLINICAL DATA:  Fever and chills EXAM: CHEST 1 VIEW COMPARISON:  None. FINDINGS: The heart size and mediastinal contours are within normal limits. Both lungs are clear. Moderate thoracic spondylosis. Chronic changes in the right shoulder. IMPRESSION: No evidence for acute cardiopulmonary abnormality. Electronically Signed   By: Nolon Nations M.D.   On: 07/10/2015 09:25   Dg Chest 2 View  07/11/2015  CLINICAL DATA:  Acute onset of fever. Status post recent total knee replacement. Initial encounter. EXAM: CHEST  2 VIEW COMPARISON:  Chest radiograph performed 07/10/2015 FINDINGS: The lungs are well-aerated and clear. There is no evidence of focal opacification, pleural effusion or pneumothorax. The heart is normal in size; the mediastinal contour is within normal limits. No acute osseous abnormalities are seen. Anterior bridging osteophytes are noted along the thoracic spine. IMPRESSION: No acute cardiopulmonary process seen. Electronically Signed   By: Garald Balding M.D.   On: 07/11/2015 22:11   Dg Knee Complete 4 Views Left  07/11/2015   CLINICAL DATA:  Status post left knee replacement 4 days ago, swelling and oozing from wound EXAM: LEFT KNEE - COMPLETE 4+ VIEW COMPARISON:  None. FINDINGS: Four views of the left knee submitted. There is left knee prosthesis with anatomic alignment. No evidence of prosthesis loosening. No acute fracture or subluxation. Soft tissue swelling is noted prepatellar and suprapatellar region. Moderate joint effusion. IMPRESSION: There is left knee prosthesis with anatomic alignment. No evidence of prosthesis loosening. Prepatellar soft tissue swelling. Moderate joint effusion. Electronically Signed   By: Lahoma Crocker M.D.   On: 07/11/2015 22:12   I have personally reviewed and evaluated these images and lab results as part of my medical decision-making.   EKG Interpretation None      MDM   Final diagnoses:  Status post total left knee replacement  Fever, unspecified fever cause   75 year old male with history of recent left knee arthroplasty on generally 16th 2017 who presents with persistent fever. Is nontoxic in no acute distress. Is febrile and mildly tachycardic on arrival but normotensive. No clear etiology of infection at this point, and he has a normal lactate and white count. Chest x-ray showing no pneumonia or other acute cardiopulmonary processes. UA showing no signs of infection. Blood cultures obtained and pending. Post-op wound with no clear signs of infection. Dr. Doran Durand, covering for Dr. Inetta Fermo evaluated wound at beside and is not concerned for infection. Feels fever may still be consistent with post-op fever. Possible DVT although less likely on Xarelto. Plan discussed with Dr. Doran Durand and family. Will hold antibiotics currently, as no clear source of infection. Will plan for DVT scan in morning and observe pending blood cultures. Discussed with Dr. Tamala Julian and admitted to [facility] under observation.    Forde Dandy, MD 07/12/15 [id number]

## 2015-07-11 NOTE — ED Notes (Signed)
Patient transported to X-ray 

## 2015-07-11 NOTE — ED Notes (Signed)
Per EMS, pt had left knee replacement surgery on Monday at [facility].  Pt reports that his knee looks infected w/ blisters, redness and hot to the touch.  He has had a fever (101) and has been taking tylenol as prescribed w/ last dose being 1000mg  at 1700 today.  Also complains of SOB at times but not at this time.

## 2015-07-12 ENCOUNTER — Observation Stay (HOSPITAL_BASED_OUTPATIENT_CLINIC_OR_DEPARTMENT_OTHER): Payer: Medicare Other

## 2015-07-12 DIAGNOSIS — M7989 Other specified soft tissue disorders: Secondary | ICD-10-CM

## 2015-07-12 DIAGNOSIS — D62 Acute posthemorrhagic anemia: Secondary | ICD-10-CM | POA: Diagnosis not present

## 2015-07-12 DIAGNOSIS — M79609 Pain in unspecified limb: Secondary | ICD-10-CM | POA: Diagnosis not present

## 2015-07-12 DIAGNOSIS — Z96652 Presence of left artificial knee joint: Secondary | ICD-10-CM

## 2015-07-12 DIAGNOSIS — E785 Hyperlipidemia, unspecified: Secondary | ICD-10-CM | POA: Diagnosis not present

## 2015-07-12 DIAGNOSIS — R509 Fever, unspecified: Secondary | ICD-10-CM | POA: Diagnosis not present

## 2015-07-12 DIAGNOSIS — I10 Essential (primary) hypertension: Secondary | ICD-10-CM

## 2015-07-12 LAB — I-STAT CG4 LACTIC ACID, ED: LACTIC ACID, VENOUS: 0.79 mmol/L (ref 0.5–2.0)

## 2015-07-12 LAB — MRSA PCR SCREENING: MRSA BY PCR: NEGATIVE

## 2015-07-12 LAB — URINALYSIS, ROUTINE W REFLEX MICROSCOPIC
Bilirubin Urine: NEGATIVE
Glucose, UA: NEGATIVE mg/dL
HGB URINE DIPSTICK: NEGATIVE
Ketones, ur: NEGATIVE mg/dL
Leukocytes, UA: NEGATIVE
NITRITE: NEGATIVE
PH: 6.5 (ref 5.0–8.0)
Protein, ur: NEGATIVE mg/dL
SPECIFIC GRAVITY, URINE: 1.02 (ref 1.005–1.030)

## 2015-07-12 LAB — C-REACTIVE PROTEIN: CRP: 9.4 mg/dL — ABNORMAL HIGH (ref <1.0)

## 2015-07-12 LAB — TSH: TSH: 1.354 u[IU]/mL (ref 0.350–4.500)

## 2015-07-12 LAB — SEDIMENTATION RATE: Sed Rate: >140 mm/hr — ABNORMAL HIGH (ref 0–16)

## 2015-07-12 MED ORDER — TRAMADOL HCL 50 MG PO TABS: 50.0000 mg | ORAL_TABLET | Freq: Four times a day (QID) | ORAL | Status: DC | PRN

## 2015-07-12 MED ORDER — FUROSEMIDE 10 MG/ML IJ SOLN
20.0000 mg | Freq: Once | INTRAMUSCULAR | Status: AC
  Administered 2015-07-12: 20 mg via INTRAVENOUS
  Filled 2015-07-12: qty 2

## 2015-07-12 MED ORDER — DOCUSATE SODIUM 100 MG PO CAPS
100.0000 mg | ORAL_CAPSULE | Freq: Every day | ORAL | Status: DC | PRN
  Administered 2015-07-12 – 2015-07-13 (×2): 100 mg via ORAL
  Filled 2015-07-12 (×2): qty 1

## 2015-07-12 MED ORDER — DOCUSATE SODIUM 100 MG PO CAPS
100.0000 mg | ORAL_CAPSULE | Freq: Once | ORAL | Status: AC
  Administered 2015-07-12: 100 mg via ORAL
  Filled 2015-07-12: qty 1

## 2015-07-12 MED ORDER — FLUTICASONE PROPIONATE 50 MCG/ACT NA SUSP
2.0000 | Freq: Every day | NASAL | Status: DC
  Filled 2015-07-12: qty 16

## 2015-07-12 MED ORDER — METHOCARBAMOL 500 MG PO TABS
500.0000 mg | ORAL_TABLET | Freq: Four times a day (QID) | ORAL | Status: DC | PRN
  Administered 2015-07-12 – 2015-07-14 (×5): 500 mg via ORAL
  Filled 2015-07-12 (×5): qty 1

## 2015-07-12 MED ORDER — RIVAROXABAN 10 MG PO TABS
10.0000 mg | ORAL_TABLET | Freq: Every day | ORAL | Status: DC
Start: 2015-07-12 — End: 2015-07-14
  Administered 2015-07-12 – 2015-07-14 (×3): 10 mg via ORAL
  Filled 2015-07-12 (×4): qty 1

## 2015-07-12 MED ORDER — VANCOMYCIN HCL IN DEXTROSE 1-5 GM/200ML-% IV SOLN
1000.0000 mg | INTRAVENOUS | Status: AC
  Administered 2015-07-12: 1000 mg via INTRAVENOUS
  Filled 2015-07-12: qty 200

## 2015-07-12 MED ORDER — ONDANSETRON HCL 4 MG PO TABS: 4.0000 mg | ORAL_TABLET | Freq: Four times a day (QID) | ORAL | Status: DC | PRN

## 2015-07-12 MED ORDER — ACETAMINOPHEN 325 MG PO TABS
650.0000 mg | ORAL_TABLET | Freq: Four times a day (QID) | ORAL | Status: DC | PRN
  Administered 2015-07-12 – 2015-07-13 (×2): 650 mg via ORAL
  Filled 2015-07-12 (×2): qty 2

## 2015-07-12 MED ORDER — ONDANSETRON HCL 4 MG/2ML IJ SOLN: 4.0000 mg | Freq: Four times a day (QID) | INTRAMUSCULAR | Status: DC | PRN

## 2015-07-12 MED ORDER — OXYCODONE HCL 5 MG PO TABS
5.0000 mg | ORAL_TABLET | ORAL | Status: DC | PRN
  Administered 2015-07-12 (×2): 10 mg via ORAL
  Administered 2015-07-12 (×3): 5 mg via ORAL
  Administered 2015-07-13 – 2015-07-14 (×8): 10 mg via ORAL
  Filled 2015-07-12 (×6): qty 2
  Filled 2015-07-12 (×3): qty 1
  Filled 2015-07-12 (×4): qty 2

## 2015-07-12 MED ORDER — BISACODYL 5 MG PO TBEC
5.0000 mg | DELAYED_RELEASE_TABLET | Freq: Every day | ORAL | Status: DC | PRN
  Administered 2015-07-12: 5 mg via ORAL
  Filled 2015-07-12: qty 1

## 2015-07-12 MED ORDER — MINOCYCLINE HCL 100 MG PO CAPS
100.0000 mg | ORAL_CAPSULE | Freq: Two times a day (BID) | ORAL | Status: DC
  Administered 2015-07-12 – 2015-07-14 (×5): 100 mg via ORAL
  Filled 2015-07-12 (×6): qty 1

## 2015-07-12 MED ORDER — SIMVASTATIN 20 MG PO TABS
20.0000 mg | ORAL_TABLET | Freq: Every day | ORAL | Status: DC
Start: 2015-07-12 — End: 2015-07-14
  Administered 2015-07-12 – 2015-07-13 (×2): 20 mg via ORAL
  Filled 2015-07-12 (×2): qty 1

## 2015-07-12 MED ORDER — ALLOPURINOL 300 MG PO TABS
300.0000 mg | ORAL_TABLET | Freq: Every day | ORAL | Status: DC
  Administered 2015-07-12 – 2015-07-14 (×3): 300 mg via ORAL
  Filled 2015-07-12 (×3): qty 1

## 2015-07-12 MED ORDER — AMLODIPINE BESYLATE 5 MG PO TABS
5.0000 mg | ORAL_TABLET | Freq: Every day | ORAL | Status: DC
  Administered 2015-07-12 – 2015-07-14 (×3): 5 mg via ORAL
  Filled 2015-07-12 (×3): qty 1

## 2015-07-12 MED ORDER — SODIUM CHLORIDE 0.9 % IJ SOLN
3.0000 mL | Freq: Two times a day (BID) | INTRAMUSCULAR | Status: DC
  Administered 2015-07-12 – 2015-07-14 (×3): 3 mL via INTRAVENOUS

## 2015-07-12 MED ORDER — ACYCLOVIR 400 MG PO TABS
400.0000 mg | ORAL_TABLET | Freq: Two times a day (BID) | ORAL | Status: DC
  Administered 2015-07-12 – 2015-07-14 (×5): 400 mg via ORAL
  Filled 2015-07-12 (×6): qty 1

## 2015-07-12 MED ORDER — OXYCODONE HCL 5 MG PO TABS
10.0000 mg | ORAL_TABLET | Freq: Once | ORAL | Status: AC
  Administered 2015-07-12: 10 mg via ORAL
  Filled 2015-07-12: qty 2

## 2015-07-12 MED ORDER — METHOCARBAMOL 500 MG PO TABS
500.0000 mg | ORAL_TABLET | Freq: Once | ORAL | Status: AC
  Administered 2015-07-12: 500 mg via ORAL
  Filled 2015-07-12: qty 1

## 2015-07-12 MED ORDER — ALPRAZOLAM 0.5 MG PO TABS
0.5000 mg | ORAL_TABLET | Freq: Three times a day (TID) | ORAL | Status: DC | PRN
  Administered 2015-07-12 (×2): 0.5 mg via ORAL
  Filled 2015-07-12 (×3): qty 1

## 2015-07-12 MED ORDER — BENAZEPRIL HCL 40 MG PO TABS
40.0000 mg | ORAL_TABLET | Freq: Every day | ORAL | Status: DC
  Administered 2015-07-12 – 2015-07-14 (×3): 40 mg via ORAL
  Filled 2015-07-12 (×3): qty 1

## 2015-07-12 NOTE — Progress Notes (Signed)
VASCULAR LAB PRELIMINARY  PRELIMINARY  PRELIMINARY  PRELIMINARY  Left lower extremity venous duplex completed.    Preliminary report:  There is no DVT or SVT noted in the left lower extremity.  There are enlarged lymph nodes noted in the bilateral groins.  Piercen Covino, RVT 07/12/2015, 11:52 AM

## 2015-07-12 NOTE — Discharge Instructions (Signed)
Fever, Adult °A fever is an increase in the body's temperature. It is often defined as a temperature of 100° F (38°C) or higher. Short mild or moderate fevers often have no long-term effects. They also often do not need treatment. Moderate or high fevers may make you feel uncomfortable. Sometimes, they can also be a sign of a serious illness or disease. The sweating that may happen with repeated fevers or fevers that last a while may also cause you to not have enough fluid in your body (dehydration). °You can take your temperature with a thermometer to see if you have a fever. A measured temperature can change with: °· Age. °· Time of day. °· Where the thermometer is placed: °¨ Mouth (oral). °¨ Rectum (rectal). °¨ Ear (tympanic). °¨ Underarm (axillary). °¨ Forehead (temporal). °HOME CARE °Pay attention to any changes in your symptoms. Take these actions to help with your condition: °· Take over-the-counter and prescription medicines only as told by your doctor. Follow the dosing instructions carefully. °· If you were prescribed an antibiotic medicine, take it as told by your doctor. Do not stop taking the antibiotic even if you start to feel better. °· Rest as needed. °· Drink enough fluid to keep your pee (urine) clear or pale yellow. °· Sponge yourself or bathe with room-temperature water as needed. This helps to lower your body temperature . Do not use ice water. °· Do not wear too many blankets or heavy clothes. °GET HELP IF: °· You throw up (vomit). °· You cannot eat or drink without throwing up. °· You have watery poop (diarrhea). °· It hurts when you pee. °· Your symptoms do not get better with treatment. °· You have new symptoms. °· You feel very weak. °GET HELP RIGHT AWAY IF: °· You are short of breath or have trouble breathing. °· You are dizzy or you pass out (faint). °· You feel confused. °· You have signs of not having enough fluid in your body, such as: °¨ A dry mouth. °¨ Peeing less. °¨ Looking  pale. °· You have very bad pain in your belly (abdomen). °· You keep throwing up or having water poop. °· You have a skin rash. °· Your symptoms suddenly get worse. °  °This information is not intended to replace advice given to you by your health care provider. Make sure you discuss any questions you have with your health care provider. °  °Document Released: 03/16/2008 Document Revised: 02/26/2015 Document Reviewed: 08/01/2014 °Elsevier Interactive Patient Education ©2016 [employer] ° °

## 2015-07-12 NOTE — Progress Notes (Signed)
7:23 AM I agree with HPI/GPe and A/P per Dr. Tamala Julian   75 y/o s/p recent tk arthroplasty 1.16.17 Prior MRSA abcess L small finger hld Htn gout Recurrent Herpes simplex admitted with fever 101 F and some blistering around wound at suture sites Orthopedics consulted  CRP  9.4 SED rate >140 Hb 7.0 BMEt wnl cxr wnl Joint XR showed mild effsion  Ortho examined and did not feel wound infected   HEENT alert pleasant no distress CHEST clear no added sopudn CARDIACs1 s2 no m/r/g.   ABDOMEN soft nt nd no rebound NEURO intact  SKIN/MUSCULAR blistering around wound.  L knee warmer than right.  rom appropriate  Patient Active Problem List   Diagnosis Date Noted  . Fever 07/12/2015  . Acute blood loss anemia 07/12/2015  . History of arthroplasty of left knee 07/12/2015  . Hyperlipidemia 07/12/2015  . Status post total left knee replacement   . OA (osteoarthritis) of knee 07/07/2015  . Routine general medical examination at a health care facility 12/23/2010  . Gout, unspecified 12/18/2009  . HEART MURMUR, SYSTOLIC 83/06/4157  . OSTEOARTHRITIS, KNEES, BILATERAL 12/11/2008  . NEOPLASM, SKIN, UNCERTAIN BEHAVIOR 73/31/2508  . HYPERLIPIDEMIA 12/08/2007  . ANXIETY 12/08/2007  . Essential hypertension 12/08/2007  . ALLERGIC RHINITIS 12/08/2007  . HEADACHE 12/08/2007   Reasonable to cover with PO ABx for blistering areas-no other source for infection.  Start Minccycline [place] he has been told to not take sulpha drugs in past but has taken?] which would cover for MRSA.  Check nasal MRSA swab.  Ortho to revisit in am CRP/ESR high-patient does have gout h/o but no joint swelling Expect if afebrile d/c home in am  Has some pvc's and tachycardia-? 2/2 to pain.  Monitor .  conside rlow dose BB  Verneita Griffes, MD [facility] [phone]

## 2015-07-12 NOTE — Consult Note (Signed)
Reason for Consult:  Fever and left knee blisters Referring Physician: Dr. Marjo Bicker is an 75 y.o. male.  HPI: 75 y/o male without significant PMH presents to the ER this evening with a c/o persistent fever s/p L TKA by Dr. Maureen Ralphs on Monday 1/16.  He denies n/v but c/o fever and chills.  He was discharged home Thursday.  He c/o pain in the knee.  He denies any pain in the calves.  He denies CP or SOB.  A HHRN saw him this afternoon and advised them to come to the ER.  He is scheduled to see Dr. Maureen Ralphs in the office Tuesday.  Past Medical History  Diagnosis Date  . Hypertension   . Hyperlipidemia   . Anxiety   . DJD (degenerative joint disease) of knee   . Gout   . HSV-1 (herpes simplex virus 1) infection   . History of right shoulder fracture   . History of weight change     lost 30 lbs over last 5 years   . Fatigue   . Balance problems   . Back pain     severe    Past Surgical History  Procedure Laterality Date  . Vasectomy  '86  . Tonsillectomy and adenoidectomy    . Tonsillectomy    . Naval hernia correction       Dr Lucia Bitter approx 10 years ago   . Total knee arthroplasty Left 07/07/2015    Procedure: LEFT TOTAL KNEE ARTHROPLASTY;  Surgeon: Gaynelle Arabian, MD;  Location: [facility];  Service: Orthopedics;  Laterality: Left;    Family History  Problem Relation Age of Onset  . Alzheimer's disease Mother   . Glaucoma Mother   . Cancer Neg Hx   . Diabetes Neg Hx   . Heart disease Brother     murmur    Social History:  reports that he has never smoked. He has never used smokeless tobacco. He reports that he drinks about 1.0 oz of alcohol per week. He reports that he does not use illicit drugs.  Allergies: No Known Allergies  Medications: I have reviewed the patient's current medications.  Results for orders placed or performed during the hospital encounter of 07/11/15 (from the past 48 hour(s))  CBC with Differential     Status: Abnormal   Collection Time:  07/11/15 10:14 PM  Result Value Ref Range   WBC 10.0 4.0 - 10.5 K/uL   RBC 2.26 (L) 4.22 - 5.81 MIL/uL   Hemoglobin 7.0 (L) 13.0 - 17.0 g/dL   HCT 20.1 (L) 39.0 - 52.0 %   MCV 88.9 78.0 - 100.0 fL   MCH 31.0 26.0 - 34.0 pg   MCHC 34.8 30.0 - 36.0 g/dL   RDW 12.7 11.5 - 15.5 %   Platelets 216 150 - 400 K/uL   Neutrophils Relative % 68 %   Neutro Abs 6.8 1.7 - 7.7 K/uL   Lymphocytes Relative 16 %   Lymphs Abs 1.6 0.7 - 4.0 K/uL   Monocytes Relative 15 %   Monocytes Absolute 1.5 (H) 0.1 - 1.0 K/uL   Eosinophils Relative 1 %   Eosinophils Absolute 0.1 0.0 - 0.7 K/uL   Basophils Relative 0 %   Basophils Absolute 0.0 0.0 - 0.1 K/uL  Comprehensive metabolic panel     Status: Abnormal   Collection Time: 07/11/15 10:14 PM  Result Value Ref Range   Sodium 136 135 - 145 mmol/L   Potassium 3.6 3.5 - 5.1  mmol/L   Chloride 101 101 - 111 mmol/L   CO2 26 22 - 32 mmol/L   Glucose, Bld 133 (H) 65 - 99 mg/dL   BUN 7 6 - 20 mg/dL   Creatinine, Ser 0.78 0.61 - 1.24 mg/dL   Calcium 8.5 (L) 8.9 - 10.3 mg/dL   Total Protein 6.2 (L) 6.5 - 8.1 g/dL   Albumin 2.7 (L) 3.5 - 5.0 g/dL   AST 30 15 - 41 U/L   ALT 22 17 - 63 U/L   Alkaline Phosphatase 72 38 - 126 U/L   Total Bilirubin 1.0 0.3 - 1.2 mg/dL   GFR calc non Af Amer >60 >60 mL/min   GFR calc Af Amer >60 >60 mL/min    Comment: (NOTE) The eGFR has been calculated using the CKD EPI equation. This calculation has not been validated in all clinical situations. eGFR's persistently <60 mL/min signify possible Chronic Kidney Disease.    Anion gap 9 5 - 15  I-Stat CG4 Lactic Acid, ED     Status: None   Collection Time: 07/11/15 10:20 PM  Result Value Ref Range   Lactic Acid, Venous 1.51 0.5 - 2.0 mmol/L  Urinalysis, Routine w reflex microscopic (not at [facility])     Status: None   Collection Time: 07/12/15 12:44 AM  Result Value Ref Range   Color, Urine YELLOW YELLOW   APPearance CLEAR CLEAR   Specific Gravity, Urine 1.020 1.005 - 1.030   pH  6.5 5.0 - 8.0   Glucose, UA NEGATIVE NEGATIVE mg/dL   Hgb urine dipstick NEGATIVE NEGATIVE   Bilirubin Urine NEGATIVE NEGATIVE   Ketones, ur NEGATIVE NEGATIVE mg/dL   Protein, ur NEGATIVE NEGATIVE mg/dL   Nitrite NEGATIVE NEGATIVE   Leukocytes, UA NEGATIVE NEGATIVE    Comment: MICROSCOPIC NOT DONE ON URINES WITH NEGATIVE PROTEIN, BLOOD, LEUKOCYTES, NITRITE, OR GLUCOSE <1000 mg/dL.  I-Stat CG4 Lactic Acid, ED     Status: None   Collection Time: 07/12/15 12:46 AM  Result Value Ref Range   Lactic Acid, Venous 0.79 0.5 - 2.0 mmol/L    Dg Chest 1 View  07/10/2015  CLINICAL DATA:  Fever and chills EXAM: CHEST 1 VIEW COMPARISON:  None. FINDINGS: The heart size and mediastinal contours are within normal limits. Both lungs are clear. Moderate thoracic spondylosis. Chronic changes in the right shoulder. IMPRESSION: No evidence for acute cardiopulmonary abnormality. Electronically Signed   By: Nolon Nations M.D.   On: 07/10/2015 09:25   Dg Chest 2 View  07/11/2015  CLINICAL DATA:  Acute onset of fever. Status post recent total knee replacement. Initial encounter. EXAM: CHEST  2 VIEW COMPARISON:  Chest radiograph performed 07/10/2015 FINDINGS: The lungs are well-aerated and clear. There is no evidence of focal opacification, pleural effusion or pneumothorax. The heart is normal in size; the mediastinal contour is within normal limits. No acute osseous abnormalities are seen. Anterior bridging osteophytes are noted along the thoracic spine. IMPRESSION: No acute cardiopulmonary process seen. Electronically Signed   By: Garald Balding M.D.   On: 07/11/2015 22:11   Dg Knee Complete 4 Views Left  07/11/2015  CLINICAL DATA:  Status post left knee replacement 4 days ago, swelling and oozing from wound EXAM: LEFT KNEE - COMPLETE 4+ VIEW COMPARISON:  None. FINDINGS: Four views of the left knee submitted. There is left knee prosthesis with anatomic alignment. No evidence of prosthesis loosening. No acute fracture  or subluxation. Soft tissue swelling is noted prepatellar and suprapatellar region. Moderate joint effusion.  IMPRESSION: There is left knee prosthesis with anatomic alignment. No evidence of prosthesis loosening. Prepatellar soft tissue swelling. Moderate joint effusion. Electronically Signed   By: Lahoma Crocker M.D.   On: 07/11/2015 22:12    ROS:  As above.  No burning with urination.  No cough. PE:  Blood pressure 106/55, pulse 50, temperature 101.9 F (38.8 C), temperature source Oral, resp. rate 26, SpO2 94 %. wn wd male in nad.  A and o x 4.  Mood is anxious.  Affect is flat.  EOMI.  resp unlabored.  L knee with moderate swelling.  Blisters along the suture line.  No purulence.  Steris still in place.  No bleeding evident.  Brisk cap refill at the toes.  No lymphadenopathy.  L knee not unusually warm or tender.  No calf tenderness.  5/5 strength at the quad.  Assessment/Plan: L knee swelling and persistent fever s/p L TKA.  The source of the fever is unclear.  He will be placed in observation status and evaluated by the hospitalist service.  An U/S of his LEs is ordered for the morning.  The family desires [facility] RN for wound care post op.  I don't see an indication for abx at this time.  I spent more than an hour evaluating the patient, discussing the treatment options and formulating a plan with the family and Dr. Oleta Mouse and [place].  Dr. Alvan Dame or I will check on the patient in the morning.  The family understands the plan and agrees.  Wylene Simmer 07/12/2015, 1:33 AM

## 2015-07-12 NOTE — ED Notes (Signed)
While obtaining urine specimen, pt visibly agitated. Wants to know why he's still in the ED. Pt doesn't feel as though anything has been done for him. Pt stated that he doesn't understand why he has to be hooked up to the continuous monitor. This RN informed pt and family educated on the safety reasons behind monitoring. Pt's daughter asked if pt could be moved to another room that is not as loud, this RN informed her that the department is full and there are no other available rooms.

## 2015-07-12 NOTE — Progress Notes (Signed)
Patient had a run of PSVT HR increased to 155 but not sustained. Patient asymptomatic. MD on call notified. Vitals stable. Will continue to monitor.

## 2015-07-12 NOTE — ED Notes (Signed)
Notified by patient placement that pt's bed placement has been changed from 5N to a cardiac unit

## 2015-07-12 NOTE — H&P (Addendum)
[facility] History and Physical  Shaun Carey MHD:[id number] DOB: 10/06/1940 DOA: 07/11/2015  Referring physician: ED PCP: Antony Blackbird, MD   Chief Complaint: Fever  HPI:  Mr. Mcelwain is a 75 year old male with past medical history of HTN, HLD, DJD, s/p left knee arthroplasty on 1/16; presents with persistent fevers. Procedure was performed by Dr. Synthia Innocent and he was just discharged from the hospital on 1 day ago (1/19). Family brings patient in see continues to have fevers since after his surgical procedure noting fevers up to 103F. They have been treating fevers with intermittent Tylenol without relief of symptoms giving him 3 g per day. Patient complains of associated symptoms of sweats. Left leg with an arthroscopic procedure was performed is swollen and family noticed that he's developed blisters along the incision sites. They question if antibiotics are needed and states that they've been told all antibiotics are warranted. Daughter and wife are at bedside and provided additional history and state and the patient was Staphylococcus positive in the nares prior to the procedure and had taken medication as prescribed, but no one rechecked his stairs to see if it was cleared. He has not had any antibiotics since following the procedures and they wonder if almost 5 days out why he would still be having these fevers 5 days postop if not infected. He's been on Xarelto since after the procedure.  Upon admission to the emergency department patient was evaluated Orthopedics was consulted and evaluated the patient and stated to admit for observation and no follow up and see patient in a.m. Blood cultures were collected. Patient was also noted  to have a irregular regular heart rhythm for which the patient states that he's never been diagnosed with atrial fibrillation before in the past..    Review of Systems  Constitutional: Positive for fever, malaise/fatigue and diaphoresis.  HENT: Negative  for ear discharge and ear pain.   Eyes: Negative for photophobia and pain.  Respiratory: Negative for sputum production and shortness of breath.   Cardiovascular: Positive for leg swelling. Negative for chest pain.  Gastrointestinal: Negative for vomiting, abdominal pain and blood in stool.  Genitourinary: Negative for urgency and hematuria.  Musculoskeletal: Positive for joint pain.  Skin: Positive for rash.        blisters  Neurological: Positive for weakness. Negative for sensory change and speech change.  Endo/Heme/Allergies: Negative for polydipsia. Bruises/bleeds easily.  Psychiatric/Behavioral: Negative for suicidal ideas and substance abuse.        Past Medical History  Diagnosis Date  . Hypertension   . Hyperlipidemia   . Anxiety   . DJD (degenerative joint disease) of knee   . Gout   . HSV-1 (herpes simplex virus 1) infection   . History of right shoulder fracture   . History of weight change     lost 30 lbs over last 5 years   . Fatigue   . Balance problems   . Back pain     severe     Past Surgical History  Procedure Laterality Date  . Vasectomy  '86  . Tonsillectomy and adenoidectomy    . Tonsillectomy    . Naval hernia correction       Dr Lucia Bitter approx 10 years ago   . Total knee arthroplasty Left 07/07/2015    Procedure: LEFT TOTAL KNEE ARTHROPLASTY;  Surgeon: Gaynelle Arabian, MD;  Location: [facility];  Service: Orthopedics;  Laterality: Left;      Social History:  reports that he has never  smoked. He has never used smokeless tobacco. He reports that he drinks about 1.0 oz of alcohol per week. He reports that he does not use illicit drugs. Where does patient live--home  and with whom if at home? wife Can patient participate in ADLs? Needs assistance following procedure  No Known Allergies  Family History  Problem Relation Age of Onset  . Alzheimer's disease Mother   . Glaucoma Mother   . Cancer Neg Hx   . Diabetes Neg Hx   . Heart disease Brother      murmur       Prior to Admission medications   Medication Sig Start Date End Date Taking? Authorizing Provider  acetaminophen (TYLENOL) 500 MG tablet Take 1,000 mg by mouth every 6 (six) hours as needed for fever.   Yes Historical Provider, MD  acyclovir (ZOVIRAX) 400 MG tablet Take 1 tablet (400 mg total) by mouth 2 (two) times daily. 12/28/12  Yes Neena Rhymes, MD  allopurinol (ZYLOPRIM) 300 MG tablet Take 1 tablet (300 mg total) by mouth daily. 12/28/12  Yes Neena Rhymes, MD  ALPRAZolam Duanne Moron) 0.5 MG tablet Take 1 tablet (0.5 mg total) by mouth 3 (three) times daily as needed for sleep. 12/28/12  Yes Neena Rhymes, MD  amLODipine (NORVASC) 10 MG tablet Take 1 tablet (10 mg total) by mouth daily. Patient taking differently: Take 5 mg by mouth daily.  12/28/12  Yes Neena Rhymes, MD  benazepril (LOTENSIN) 40 MG tablet Take 1 tablet (40 mg total) by mouth daily. 12/28/12  Yes Neena Rhymes, MD  Coenzyme Q10 (COQ10 PO) Take 1 tablet by mouth daily.   Yes Historical Provider, MD  docusate sodium (COLACE) 100 MG capsule Take 100 mg by mouth daily as needed for mild constipation.   Yes Historical Provider, MD  fluticasone (FLONASE) 50 MCG/ACT nasal spray Place 2 sprays into the nose as directed. Patient taking differently: Place 2 sprays into the nose daily as needed for allergies or rhinitis.  12/28/12  Yes Neena Rhymes, MD  methocarbamol (ROBAXIN) 500 MG tablet Take 1 tablet (500 mg total) by mouth every 6 (six) hours as needed for muscle spasms. 07/08/15  Yes Arlee Muslim, PA-C  oxyCODONE (OXY IR/ROXICODONE) 5 MG immediate release tablet Take 1-2 tablets (5-10 mg total) by mouth every 3 (three) hours as needed for moderate pain or severe pain. 07/08/15  Yes Arlee Muslim, PA-C  rivaroxaban (XARELTO) 10 MG TABS tablet Take 1 tablet (10 mg total) by mouth daily with breakfast. Take Xarelto for two and a half more weeks, then discontinue Xarelto. Once the patient has completed the  blood thinner regimen, then take a Baby 81 mg Aspirin daily for three more weeks. 07/08/15  Yes Arlee Muslim, PA-C  simvastatin (ZOCOR) 20 MG tablet Take 1 tablet (20 mg total) by mouth at bedtime. 12/28/12  Yes Neena Rhymes, MD  iron polysaccharides (NIFEREX) 150 MG capsule Take 1 capsule (150 mg total) by mouth 2 (two) times daily. 07/10/15   Arlee Muslim, PA-C  traMADol (ULTRAM) 50 MG tablet Take 1-2 tablets (50-100 mg total) by mouth every 6 (six) hours as needed (mild to moderate pain). 07/08/15   Arlee Muslim, PA-C     Physical Exam: Filed Vitals:   07/12/15 0034 07/12/15 0116 07/12/15 0117 07/12/15 0130  BP:  106/55  113/62  Pulse:  50  89  Temp: 101.9 F (38.8 C)     TempSrc: Oral     Resp:  26  25  SpO2:  91% 94%      Constitutional: Vital signs reviewed. Patient is  Elderly appearing man who appears well-nourished in no acute distress. Head: Normocephalic and atraumatic  Ear: TM normal bilaterally  Mouth: no erythema or exudates, MMM  Eyes: PERRL, EOMI, conjunctivae normal, No scleral icterus.  Neck: Supple, Trachea midline normal ROM, No JVD, mass, thyromegaly, or carotid bruit present.  Cardiovascular:  Tachycardic with what appears to be a irregular irregular rhythm Pulmonary/Chest: CTAB, no wheezes, rales, or rhonchi  Abdominal: Soft. Non-tender, non-distended, bowel sounds are normal, no masses, organomegaly, or guarding present.  GU: no CVA tenderness Musculoskeletal: No joint deformities, erythema, or stiffness, ROM full and no nontender Ext: Edema to the left leg with warm and erythema Hematology: no cervical, inginal, or axillary adenopathy.  Neurological: A&O x3, Strenght is normal and symmetric bilaterally, cranial nerve II-XII are grossly intact, no focal motor deficit, sensory intact to light touch bilaterally.  Skin:  Hemorrhagic blisters some appear to have ruptured around surgical's site with appearance of serosanguineous drainage  Psychiatric: Normal mood  and affect. speech and behavior is normal. Judgment and thought content normal. Cognition and memory are normal.      Data Review   Micro Results No results found for this or any previous visit (from the past 240 hour(s)).  Radiology Reports Dg Chest 1 View  07/10/2015  CLINICAL DATA:  Fever and chills EXAM: CHEST 1 VIEW COMPARISON:  None. FINDINGS: The heart size and mediastinal contours are within normal limits. Both lungs are clear. Moderate thoracic spondylosis. Chronic changes in the right shoulder. IMPRESSION: No evidence for acute cardiopulmonary abnormality. Electronically Signed   By: Nolon Nations M.D.   On: 07/10/2015 09:25   Dg Chest 2 View  07/11/2015  CLINICAL DATA:  Acute onset of fever. Status post recent total knee replacement. Initial encounter. EXAM: CHEST  2 VIEW COMPARISON:  Chest radiograph performed 07/10/2015 FINDINGS: The lungs are well-aerated and clear. There is no evidence of focal opacification, pleural effusion or pneumothorax. The heart is normal in size; the mediastinal contour is within normal limits. No acute osseous abnormalities are seen. Anterior bridging osteophytes are noted along the thoracic spine. IMPRESSION: No acute cardiopulmonary process seen. Electronically Signed   By: Garald Balding M.D.   On: 07/11/2015 22:11   Dg Knee Complete 4 Views Left  07/11/2015  CLINICAL DATA:  Status post left knee replacement 4 days ago, swelling and oozing from wound EXAM: LEFT KNEE - COMPLETE 4+ VIEW COMPARISON:  None. FINDINGS: Four views of the left knee submitted. There is left knee prosthesis with anatomic alignment. No evidence of prosthesis loosening. No acute fracture or subluxation. Soft tissue swelling is noted prepatellar and suprapatellar region. Moderate joint effusion. IMPRESSION: There is left knee prosthesis with anatomic alignment. No evidence of prosthesis loosening. Prepatellar soft tissue swelling. Moderate joint effusion. Electronically Signed    By: Lahoma Crocker M.D.   On: 07/11/2015 22:12     CBC  Recent Labs Lab 07/08/15 0422 07/09/15 0410 07/10/15 0650 07/11/15 2214  WBC 10.5 11.6* 11.8* 10.0  HGB 10.1* 8.8* 7.6* 7.0*  HCT 30.6* 26.3* 22.9* 20.1*  PLT 149* 159 147* 216  MCV 91.9 91.6 91.6 88.9  MCH 30.3 30.7 30.4 31.0  MCHC 33.0 33.5 33.2 34.8  RDW 12.7 12.8 12.9 12.7  LYMPHSABS  --   --   --  1.6  MONOABS  --   --   --  1.5*  EOSABS  --   --   --  0.1  BASOSABS  --   --   --  0.0    Chemistries   Recent Labs Lab 07/08/15 0422 07/09/15 0410 07/11/15 2214  NA 140 140 136  K 4.2 3.6 3.6  CL 107 102 101  CO2 '27 27 26  ' GLUCOSE 168* 102* 133*  BUN '8 9 7  ' CREATININE 0.70 0.65 0.78  CALCIUM 9.2 8.9 8.5*  AST  --   --  30  ALT  --   --  22  ALKPHOS  --   --  72  BILITOT  --   --  1.0   ------------------------------------------------------------------------------------------------------------------ estimated creatinine clearance is 90.3 mL/min (by C-G formula based on Cr of 0.78). ------------------------------------------------------------------------------------------------------------------ No results for input(s): HGBA1C in the last 72 hours. ------------------------------------------------------------------------------------------------------------------ No results for input(s): CHOL, HDL, LDLCALC, TRIG, CHOLHDL, LDLDIRECT in the last 72 hours. ------------------------------------------------------------------------------------------------------------------ No results for input(s): TSH, T4TOTAL, T3FREE, THYROIDAB in the last 72 hours.  Invalid input(s): FREET3 ------------------------------------------------------------------------------------------------------------------ No results for input(s): VITAMINB12, FOLATE, FERRITIN, TIBC, IRON, RETICCTPCT in the last 72 hours.  Coagulation profile No results for input(s): INR, PROTIME in the last 168 hours.  No results for input(s): DDIMER in the last 72  hours.  Cardiac Enzymes No results for input(s): CKMB, TROPONINI, MYOGLOBIN in the last 168 hours.  Invalid input(s): CK ------------------------------------------------------------------------------------------------------------------ Invalid input(s): POCBNP   CBG: No results for input(s): GLUCAP in the last 168 hours.     EKG: Independently reviewed.    Assessment/Plan Active Problems:    Fever:  Acute. Patient with continuing  fevers almost 5 days after postop. Currently on Xarelto so suspect less likely a DVT. Orthopedics consulted and evaluated the patient and will see in a.m.Given patient's hemorrhagic appearing blisters that have ruptured  around the surgical site and is a possible source of infection. -  Admit to a telemetry bed -   Follow-up blood cultures -  Check CRP and ESR as would suspect these will be within normal limits  5 days postop -   Give antibiotics of vancomycin  1 dose now continue warranted -   Tylenol when necessary fever -   Follow-up orthopedic recommendation -   Continue Xarelto -   Oxycodone when necessary pain  Anemia:  Hemoglobin  7 on admission, and has been subsequently trending down following surgery. The patient is on Xarelto so we'll need to closely monitor. -  Continue to monitor -  Would start patient on ferrous sulfate  Sinus rhythm with Premature atrial complexes with Abberant conduction:  Initially thought this was atrial fibrillation based off physical exam. Patient notes that this has previously been worked up in the past -  Follow-up telemetry -  Discontinued previously ordered echocardiogram  Status post Left knee arthroplasty -  Per orthopedics  Essential hypertension -  Continue amlodipine and  Lotensin  Hyperlipidemia - continued symptoms  Code Status:   full Family Communication: bedside Disposition Plan: admit   Total time spent 55 minutes.Greater than 50% of this time was spent in counseling, explanation of  diagnosis, planning of further management, and coordination of care  [facility] Pager [phone]  If 7PM-7AM, please contact night-coverage [email or web address] Password [email or web address] 07/12/2015, 1:59 AM

## 2015-07-13 DIAGNOSIS — R509 Fever, unspecified: Secondary | ICD-10-CM | POA: Diagnosis not present

## 2015-07-13 DIAGNOSIS — I10 Essential (primary) hypertension: Secondary | ICD-10-CM | POA: Diagnosis not present

## 2015-07-13 DIAGNOSIS — Z96652 Presence of left artificial knee joint: Secondary | ICD-10-CM | POA: Diagnosis not present

## 2015-07-13 DIAGNOSIS — E785 Hyperlipidemia, unspecified: Secondary | ICD-10-CM | POA: Diagnosis not present

## 2015-07-13 LAB — CBC
HCT: 17.4 % — ABNORMAL LOW (ref 39.0–52.0)
Hemoglobin: 6 g/dL — CL (ref 13.0–17.0)
MCH: 29.4 pg (ref 26.0–34.0)
MCHC: 33.3 g/dL (ref 30.0–36.0)
MCV: 88.3 fL (ref 78.0–100.0)
PLATELETS: 247 10*3/uL (ref 150–400)
RBC: 1.97 MIL/uL — AB (ref 4.22–5.81)
RDW: 12.8 % (ref 11.5–15.5)
WBC: 8.5 10*3/uL (ref 4.0–10.5)

## 2015-07-13 LAB — PREPARE RBC (CROSSMATCH)

## 2015-07-13 LAB — ABO/RH: ABO/RH(D): O NEG

## 2015-07-13 MED ORDER — ALPRAZOLAM 0.5 MG PO TABS
0.5000 mg | ORAL_TABLET | Freq: Three times a day (TID) | ORAL | Status: DC
  Administered 2015-07-13 – 2015-07-14 (×4): 0.5 mg via ORAL
  Filled 2015-07-13 (×4): qty 1

## 2015-07-13 MED ORDER — CARVEDILOL PHOSPHATE ER 10 MG PO CP24
10.0000 mg | ORAL_CAPSULE | Freq: Every day | ORAL | Status: DC
  Administered 2015-07-13 – 2015-07-14 (×2): 10 mg via ORAL
  Filled 2015-07-13 (×3): qty 1

## 2015-07-13 MED ORDER — POLYETHYLENE GLYCOL 3350 17 G PO PACK
17.0000 g | PACK | Freq: Every day | ORAL | Status: DC
  Administered 2015-07-13 – 2015-07-14 (×2): 17 g via ORAL
  Filled 2015-07-13 (×2): qty 1

## 2015-07-13 MED ORDER — SODIUM CHLORIDE 0.9 % IV SOLN: Freq: Once | INTRAVENOUS | Status: DC

## 2015-07-13 MED ORDER — BISACODYL 5 MG PO TBEC: 5.0000 mg | DELAYED_RELEASE_TABLET | Freq: Every day | ORAL | Status: DC | PRN

## 2015-07-13 NOTE — Progress Notes (Signed)
Patient ID: Shaun Carey, male   DOB: [birth year], 75 y.o.   MRN: [id number]  Date of exam: 1/21 6pm  Patient was seen after admission for left leg swelling and blistering around incision from TKR last week Reviewed reports of fevers at home  Significant post-op blistering around left knee incision   I did release the many blisters After cleaning with betadine they were lanced with 18 gauge needle The whole knee was then dressed with zeroform gauze  Agree with transitioning to PO minocycline for coverage until wound completely heals OK to clean with soap and water and keep wounds dry Home health nursing for monitoring To follow up with [facility] next week

## 2015-07-13 NOTE — Progress Notes (Signed)
CHAYTON MURATA KSH:[id number] DOB: 01/19/1941 DOA: 07/11/2015 PCP: Antony Blackbird, MD  Brief narrative:    75 y/o s/p recent tk arthroplasty 1.16.17 Prior MRSA abcess L small finger hld Htn gout Recurrent Herpes simplex admitted with fever 101 F and some blistering around wound at suture sites Orthopedics consulted  CRP  9.4 SED rate >140 Hb 7.0 BMEt wnl cxr wnl Joint XR showed mild effsion  Ortho examined and did not feel wound infected    Past medical history-As per Problem list Chart reviewed as below-  Consultants:  Orthopedics  Procedures:  Bedside I and D  Antibiotics:  Vancomycin 1/21  Minocycline 1/21    Subjective  Anxious, constipated but passed a stool according to RN No chest pain No nausea No vomiting    Objective    Interim History:   Telemetry: Sinus tach with PVCs 110 range F2   Objective: Filed Vitals:   07/12/15 2113 07/12/15 2248 07/12/15 2325 07/13/15 0516  BP: 118/54   128/51  Pulse: 75   56  Temp: 101.4 F (38.6 C) 100.3 F (37.9 C) 99.5 F (37.5 C) 98.5 F (36.9 C)  TempSrc: Oral Oral Oral Oral  Resp: 18   18  Height:      Weight:      SpO2: 94%   98%    Intake/Output Summary (Last 24 hours) at 07/13/15 1009 Last data filed at 07/13/15 0519  Gross per 24 hour  Intake    480 ml  Output    150 ml  Net    330 ml    Exam:  HEENT alert pleasant no distress CHEST clear no added sopudn CARDIACs1 s2 no m/r/g.    ABDOMEN soft nt nd no rebound NEURO intact   SKIN/MUSCULAR wound not examined today      Data Reviewed: Basic Metabolic Panel:  Recent Labs Lab 07/08/15 0422 07/09/15 0410 07/11/15 2214  NA 140 140 136  K 4.2 3.6 3.6  CL 107 102 101  CO2 '27 27 26  ' GLUCOSE 168* 102* 133*  BUN '8 9 7  ' CREATININE 0.70 0.65 0.78  CALCIUM 9.2 8.9 8.5*   Liver Function Tests:  Recent Labs Lab 07/11/15 2214  AST 30  ALT 22  ALKPHOS 72  BILITOT 1.0  PROT 6.2*  ALBUMIN 2.7*   No results for  input(s): LIPASE, AMYLASE in the last 168 hours. No results for input(s): AMMONIA in the last 168 hours. CBC:  Recent Labs Lab 07/08/15 0422 07/09/15 0410 07/10/15 0650 07/11/15 2214 07/13/15 0350  WBC 10.5 11.6* 11.8* 10.0 8.5  NEUTROABS  --   --   --  6.8  --   HGB 10.1* 8.8* 7.6* 7.0* 6.0*  HCT 30.6* 26.3* 22.9* 20.1* 17.4*  MCV 91.9 91.6 91.6 88.9 88.3  PLT 149* 159 147* 216 247   Cardiac Enzymes: No results for input(s): CKTOTAL, CKMB, CKMBINDEX, TROPONINI in the last 168 hours. BNP: Invalid input(s): POCBNP CBG: No results for input(s): GLUCAP in the last 168 hours.  Recent Results (from the past 240 hour(s))  MRSA PCR Screening     Status: None   Collection Time: 07/12/15  8:46 PM  Result Value Ref Range Status   MRSA by PCR NEGATIVE NEGATIVE Final    Comment:        The GeneXpert MRSA Assay (FDA approved for NASAL specimens only), is one component of a comprehensive MRSA colonization surveillance program. It is not intended to diagnose MRSA infection nor to guide or  monitor treatment for MRSA infections.      Studies:              All Imaging reviewed and is as per above notation   Scheduled Meds: . sodium chloride   Intravenous Once  . acyclovir  400 mg Oral BID  . allopurinol  300 mg Oral Daily  . ALPRAZolam  0.5 mg Oral TID  . amLODipine  5 mg Oral Daily  . benazepril  40 mg Oral Daily  . carvedilol  10 mg Oral Daily  . fluticasone  2 spray Each Nare Daily  . minocycline  100 mg Oral BID  . polyethylene glycol  17 g Oral Daily  . rivaroxaban  10 mg Oral Q breakfast  . simvastatin  20 mg Oral QHS  . sodium chloride  3 mL Intravenous Q12H   Continuous Infusions:    Assessment/Plan:  Possible mild low-grade cellulitis Reasonable to cover with PO ABx for blistering areas-no other source for infection.Continue minocycline to cover for MRSA received one dose of IV vancomycin- Ortho  input greatly appreciated  CRP/ESR high-patient does have gout  h/o but no joint swelling Expect if afebrile d/c home in am  Acute blood loss anemia expected secondary to surgery Transfuse 2 units PRBC  Has some pvc's and tachycardia-? 2/2 to pain. Monitor .Added Coreg CD 10 mg    long discussion with family at bedside and on phone  Transfuse and potential discharge in a.m. if afebrile once orthopedics sees patient in the morning   Verneita Griffes, MD  [facility] Pager [phone] 07/13/2015, 10:09 AM

## 2015-07-13 NOTE — Progress Notes (Signed)
Subjective:     Patient reports pain as 3 on 0-10 scale. Dressing changed and Xeroform dressing in place. Some erythema around the wound site. Still running a low grade fever. Case discussed with his wife and Hospitalist. His Vancomycin was DCd and he was started on Minicycline by his MD.He received 2-units of Packed RBC   Objective: Vital signs in last 24 hours: Temp:  [98.5 F (36.9 C)-101.4 F (38.6 C)] 98.5 F (36.9 C) (01/22 0516) Pulse Rate:  [56-75] 56 (01/22 0516) Resp:  [18] 18 (01/22 0516) BP: (113-131)/(51-82) 128/51 mmHg (01/22 0516) SpO2:  [94 %-100 %] 98 % (01/22 0516)  Intake/Output from previous day: 01/21 0701 - 01/22 0700 In: 720 [P.O.:720] Out: 150 [Urine:150] Intake/Output this shift:     Recent Labs  07/11/15 2214 07/13/15 0350  HGB 7.0* 6.0*    Recent Labs  07/11/15 2214 07/13/15 0350  WBC 10.0 8.5  RBC 2.26* 1.97*  HCT 20.1* 17.4*  PLT 216 247    Recent Labs  07/11/15 2214  NA 136  K 3.6  CL 101  CO2 26  BUN 7  CREATININE 0.78  GLUCOSE 133*  CALCIUM 8.5*   No results for input(s): LABPT, INR in the last 72 hours.  Compartment soft Dressing changed and Xeroform guaze is in place. Mild cellulitis.  Assessment/Plan:     Up with therapy  Shaun Carey A 07/13/2015, 9:28 AM

## 2015-07-13 NOTE — Progress Notes (Signed)
CRITICAL VALUE ALERT  Critical value received:  Hemoglobin 6.0  Date of notification:  07/13/2015  Time of notification:  0400  Critical value read back:yes  Nurse who received alert: Awanda Mink, RN  MD notified (1st page):  K. Sofia,NP  Time of first page:  04:05 am

## 2015-07-13 NOTE — Progress Notes (Signed)
Pt received one unit of blood. Pt had a slight fever of 100.2. Pt given tylenol. MD notified. Will continue to monitor.

## 2015-07-14 DIAGNOSIS — I10 Essential (primary) hypertension: Secondary | ICD-10-CM | POA: Diagnosis not present

## 2015-07-14 DIAGNOSIS — E785 Hyperlipidemia, unspecified: Secondary | ICD-10-CM | POA: Diagnosis not present

## 2015-07-14 DIAGNOSIS — Z96652 Presence of left artificial knee joint: Secondary | ICD-10-CM | POA: Diagnosis not present

## 2015-07-14 DIAGNOSIS — R509 Fever, unspecified: Secondary | ICD-10-CM | POA: Diagnosis not present

## 2015-07-14 LAB — TYPE AND SCREEN
ABO/RH(D): O NEG
Antibody Screen: NEGATIVE
UNIT DIVISION: 0
Unit division: 0

## 2015-07-14 LAB — CBC
HEMATOCRIT: 23.5 % — AB (ref 39.0–52.0)
Hemoglobin: 8.1 g/dL — ABNORMAL LOW (ref 13.0–17.0)
MCH: 30 pg (ref 26.0–34.0)
MCHC: 34.5 g/dL (ref 30.0–36.0)
MCV: 87 fL (ref 78.0–100.0)
PLATELETS: 284 10*3/uL (ref 150–400)
RBC: 2.7 MIL/uL — ABNORMAL LOW (ref 4.22–5.81)
RDW: 14 % (ref 11.5–15.5)
WBC: 10.8 10*3/uL — AB (ref 4.0–10.5)

## 2015-07-14 LAB — BASIC METABOLIC PANEL
Anion gap: 7 (ref 5–15)
BUN: 9 mg/dL (ref 6–20)
CALCIUM: 8.3 mg/dL — AB (ref 8.9–10.3)
CO2: 27 mmol/L (ref 22–32)
CREATININE: 0.8 mg/dL (ref 0.61–1.24)
Chloride: 103 mmol/L (ref 101–111)
GFR calc Af Amer: >60 mL/min (ref >60)
Glucose, Bld: 117 mg/dL — ABNORMAL HIGH (ref 65–99)
POTASSIUM: 3.5 mmol/L (ref 3.5–5.1)
SODIUM: 137 mmol/L (ref 135–145)

## 2015-07-14 MED ORDER — CARVEDILOL PHOSPHATE ER 10 MG PO CP24: 10.0000 mg | ORAL_CAPSULE | Freq: Every day | ORAL | Status: AC

## 2015-07-14 MED ORDER — POLYETHYLENE GLYCOL 3350 17 G PO PACK: 17.0000 g | PACK | Freq: Every day | ORAL | Status: AC

## 2015-07-14 MED ORDER — MINOCYCLINE HCL 100 MG PO CAPS: 100.0000 mg | ORAL_CAPSULE | Freq: Two times a day (BID) | ORAL | Status: AC

## 2015-07-14 NOTE — Care Management Note (Signed)
Case Management Note Marvetta Gibbons RN, BSN Unit 2W-Case Manager [phone]  Patient Details  Name: Shaun Carey MRN: [id number] Date of Birth: 1941/04/30  Subjective/Objective:   Pt admitted with fever                 Action/Plan: PTA pt lived at home- was active with [facility] for RN/PT services- will resume HH services-as previously ordered- pt will also need wound care for blisters- orders have been placed- spoke with pt at bedside- who states that he wants to continue services with [facility]- call made to [facility] with [facility] regarding pt's discharge, resumption of services and [place] needs in reference to wound care/drsg changes she will f/u with ortho. MD for further clarification.   Expected Discharge Date:    07/14/15             Expected Discharge Plan:  [place]  In-House Referral:     Discharge planning Services  CM Consult  Post Acute Care Choice:  Home Health, Resumption of Svcs/PTA Provider Choice offered to:  Patient  DME Arranged:    DME Agency:     HH Arranged:  RN, PT HH Agency:  [facility]  Status of Service:  Completed, signed off  Medicare Important Message Given:    Date Medicare IM Given:    Medicare IM give by:    Date Additional Medicare IM Given:    Additional Medicare Important Message give by:     If discussed at [place] of Stay Meetings, dates discussed:    Discharge Disposition: Home/Home Health   Additional Comments:  Dawayne Patricia, RN 07/14/2015, 10:17 AM

## 2015-07-14 NOTE — Discharge Summary (Signed)
Physician Discharge Summary  Shaun Carey:[id number] DOB: 22-Jun-1940 DOA: 07/11/2015  PCP: Antony Blackbird, MD  Admit date: 07/11/2015 Discharge date: 07/14/2015  Time spent: 35 minutes  Recommendations for Outpatient Follow-up:  1. Complete minocycline prescription on 07/18/15 2. Get CBC plus differential within 2-4 days 3. Added Coreg 10 mg daily tachycardia which is likely related to anxiety 4. Home health will be reordered as will RN and patient will need to have dressings with Xeroform dressings 5. Patient should follow up closely with Dr. Maureen Ralphs   Discharge Diagnoses:  Principal Problem:   Fever Active Problems:   Essential hypertension   Acute blood loss anemia   History of arthroplasty of left knee   Hyperlipidemia   Status post total left knee replacement   Discharge Condition: fair  Diet recommendation: hh low salt  Filed Weights   07/12/15 0300  Weight: 86.41 kg (190 lb 8 oz)    History of present illness:  75 y/o s/p recent tk arthroplasty 1.16.17 Prior MRSA abcess L small finger hld Htn gout Recurrent Herpes simplex admitted with fever 101 F and some blistering around wound at suture sites Orthopedics consulted  CRP 9.4 SED rate >140 Hb 7.0 BMEt wnl cxr wnl Joint XR showed mild effsion  Ortho examined and did not feel wound infected  Hospital Course:  Possible mild low-grade cellulitis Reasonable to cover with PO ABx for blistering areas-no other source for infection. Continue minocycline to cover for MRSA received one dose of IV vancomycin-  Ortho saw the patient on multiple occasions and did a bedside debridement of the blebs CRP/ESR high-patient does have gout h/o but no joint swelling patient had only low-grade temperatures patient only had low-grade temperatures and was discharged home  Acute blood loss anemia expected secondary to surgery Transfuse 2 units PRBC Hemoglobin posttransfusion was 8. Patient should be rechecked in the  next 2-4 weeks  Has some pvc's and tachycardia-? 2/2 to pain. Monitor .Added Coreg CD 10 mg  Filed Vitals:   07/13/15 1913 07/14/15 0820  BP: 113/64   Pulse: 84   Temp: 99.8 F (37.7 C) 99.5 F (37.5 C)  Resp: 18     Alert oriented anxiousfor other issues Tolerating diet Some discomfort in the leg but better than before  Discharge Instructions Discussed with family at bedside  Discharge Instructions    Diet - low sodium heart healthy    Complete by:  As directed      Discharge instructions    Complete by:  As directed   Complete minocycline in 4 days on 1/27 For your tachycardia we have added Coreg 10 mg extended release she can pick up from the pharmacy Please follow-up with your regular orthopedic surgeon as an outpatient Please follow-up with primary care physician in about one week for blood work     Increase activity slowly    Complete by:  As directed           Current Discharge Medication List    START taking these medications   Details  carvedilol (COREG CR) 10 MG 24 hr capsule Take 1 capsule (10 mg total) by mouth daily. Qty: 30 capsule, Refills: 0    minocycline (MINOCIN,DYNACIN) 100 MG capsule Take 1 capsule (100 mg total) by mouth 2 (two) times daily. Qty: 8 capsule, Refills: 0    polyethylene glycol (MIRALAX / GLYCOLAX) packet Take 17 g by mouth daily. Qty: 14 each, Refills: 0      CONTINUE these medications which have  NOT CHANGED   Details  acetaminophen (TYLENOL) 500 MG tablet Take 1,000 mg by mouth every 6 (six) hours as needed for fever.    acyclovir (ZOVIRAX) 400 MG tablet Take 1 tablet (400 mg total) by mouth 2 (two) times daily. Qty: 180 tablet, Refills: 3    allopurinol (ZYLOPRIM) 300 MG tablet Take 1 tablet (300 mg total) by mouth daily. Qty: 90 tablet, Refills: 3    ALPRAZolam (XANAX) 0.5 MG tablet Take 1 tablet (0.5 mg total) by mouth 3 (three) times daily as needed for sleep. Qty: 270 tablet, Refills: 3    amLODipine (NORVASC) 10  MG tablet Take 1 tablet (10 mg total) by mouth daily. Qty: 30 tablet, Refills: 0    benazepril (LOTENSIN) 40 MG tablet Take 1 tablet (40 mg total) by mouth daily. Qty: 90 tablet, Refills: 3    Coenzyme Q10 (COQ10 PO) Take 1 tablet by mouth daily.    docusate sodium (COLACE) 100 MG capsule Take 100 mg by mouth daily as needed for mild constipation.    fluticasone (FLONASE) 50 MCG/ACT nasal spray Place 2 sprays into the nose as directed. Qty: 16 g, Refills: 3    methocarbamol (ROBAXIN) 500 MG tablet Take 1 tablet (500 mg total) by mouth every 6 (six) hours as needed for muscle spasms. Qty: 80 tablet, Refills: 0    oxyCODONE (OXY IR/ROXICODONE) 5 MG immediate release tablet Take 1-2 tablets (5-10 mg total) by mouth every 3 (three) hours as needed for moderate pain or severe pain. Qty: 80 tablet, Refills: 0    rivaroxaban (XARELTO) 10 MG TABS tablet Take 1 tablet (10 mg total) by mouth daily with breakfast. Take Xarelto for two and a half more weeks, then discontinue Xarelto. Once the patient has completed the blood thinner regimen, then take a Baby 81 mg Aspirin daily for three more weeks. Qty: 19 tablet, Refills: 0    simvastatin (ZOCOR) 20 MG tablet Take 1 tablet (20 mg total) by mouth at bedtime. Qty: 90 tablet, Refills: 1    iron polysaccharides (NIFEREX) 150 MG capsule Take 1 capsule (150 mg total) by mouth 2 (two) times daily. Qty: 42 capsule, Refills: 0    traMADol (ULTRAM) 50 MG tablet Take 1-2 tablets (50-100 mg total) by mouth every 6 (six) hours as needed (mild to moderate pain). Qty: 80 tablet, Refills: 1       No Known Allergies    The results of significant diagnostics from this hospitalization (including imaging, microbiology, ancillary and laboratory) are listed below for reference.    Significant Diagnostic Studies: Dg Chest 1 View  07/10/2015  CLINICAL DATA:  Fever and chills EXAM: CHEST 1 VIEW COMPARISON:  None. FINDINGS: The heart size and mediastinal  contours are within normal limits. Both lungs are clear. Moderate thoracic spondylosis. Chronic changes in the right shoulder. IMPRESSION: No evidence for acute cardiopulmonary abnormality. Electronically Signed   By: Nolon Nations M.D.   On: 07/10/2015 09:25   Dg Chest 2 View  07/11/2015  CLINICAL DATA:  Acute onset of fever. Status post recent total knee replacement. Initial encounter. EXAM: CHEST  2 VIEW COMPARISON:  Chest radiograph performed 07/10/2015 FINDINGS: The lungs are well-aerated and clear. There is no evidence of focal opacification, pleural effusion or pneumothorax. The heart is normal in size; the mediastinal contour is within normal limits. No acute osseous abnormalities are seen. Anterior bridging osteophytes are noted along the thoracic spine. IMPRESSION: No acute cardiopulmonary process seen. Electronically Signed   By: Jacqulynn Cadet  Chang M.D.   On: 07/11/2015 22:11   Dg Knee Complete 4 Views Left  07/11/2015  CLINICAL DATA:  Status post left knee replacement 4 days ago, swelling and oozing from wound EXAM: LEFT KNEE - COMPLETE 4+ VIEW COMPARISON:  None. FINDINGS: Four views of the left knee submitted. There is left knee prosthesis with anatomic alignment. No evidence of prosthesis loosening. No acute fracture or subluxation. Soft tissue swelling is noted prepatellar and suprapatellar region. Moderate joint effusion. IMPRESSION: There is left knee prosthesis with anatomic alignment. No evidence of prosthesis loosening. Prepatellar soft tissue swelling. Moderate joint effusion. Electronically Signed   By: Lahoma Crocker M.D.   On: 07/11/2015 22:12    Microbiology: Recent Results (from the past 240 hour(s))  Blood culture (routine x 2)     Status: None (Preliminary result)   Collection Time: 07/12/15  1:46 AM  Result Value Ref Range Status   Specimen Description BLOOD RIGHT ARM  Final   Special Requests BOTTLES DRAWN AEROBIC AND ANAEROBIC 10ML  Final   Culture NO GROWTH 1 DAY  Final    Report Status PENDING  Incomplete  Blood culture (routine x 2)     Status: None (Preliminary result)   Collection Time: 07/12/15  1:47 AM  Result Value Ref Range Status   Specimen Description BLOOD LEFT HAND  Final   Special Requests BOTTLES DRAWN AEROBIC AND ANAEROBIC 10ML  Final   Culture NO GROWTH 1 DAY  Final   Report Status PENDING  Incomplete  MRSA PCR Screening     Status: None   Collection Time: 07/12/15  8:46 PM  Result Value Ref Range Status   MRSA by PCR NEGATIVE NEGATIVE Final    Comment:        The GeneXpert MRSA Assay (FDA approved for NASAL specimens only), is one component of a comprehensive MRSA colonization surveillance program. It is not intended to diagnose MRSA infection nor to guide or monitor treatment for MRSA infections.      Labs: Basic Metabolic Panel:  Recent Labs Lab 07/08/15 0422 07/09/15 0410 07/11/15 2214 07/14/15 0338  NA 140 140 136 137  K 4.2 3.6 3.6 3.5  CL 107 102 101 103  CO2 _0 GLUCOSE 168* 102* 133* 117*  BUN _1 CREATININE 0.70 0.65 0.78 0.80  CALCIUM 9.2 8.9 8.5* 8.3*   Liver Function Tests:  Recent Labs Lab 07/11/15 2214  AST 30  ALT 22  ALKPHOS 72  BILITOT 1.0  PROT 6.2*  ALBUMIN 2.7*   No results for input(s): LIPASE, AMYLASE in the last 168 hours. No results for input(s): AMMONIA in the last 168 hours. CBC:  Recent Labs Lab 07/09/15 0410 07/10/15 0650 07/11/15 2214 07/13/15 0350 07/14/15 0338  WBC 11.6* 11.8* 10.0 8.5 10.8*  NEUTROABS  --   --  6.8  --   --   HGB 8.8* 7.6* 7.0* 6.0* 8.1*  HCT 26.3* 22.9* 20.1* 17.4* 23.5*  MCV 91.6 91.6 88.9 88.3 87.0  PLT 159 147* 216 247 284   Cardiac Enzymes: No results for input(s): CKTOTAL, CKMB, CKMBINDEX, TROPONINI in the last 168 hours. BNP: BNP (last 3 results) No results for input(s): BNP in the last 8760 hours.  ProBNP (last 3 results) No results for input(s): PROBNP in the last 8760 hours.  CBG: No results for input(s): GLUCAP in  the last 168 hours.     SignedNita Sells MD.  [facility] 07/14/2015, 10:02 AM

## 2015-07-17 LAB — CULTURE, BLOOD (ROUTINE X 2)
CULTURE: NO GROWTH
CULTURE: NO GROWTH

## 2015-10-14 ENCOUNTER — Ambulatory Visit: Payer: Self-pay | Admitting: Orthopedic Surgery

## 2015-10-14 NOTE — H&P (Signed)
Shaun Carey DOB: [birth year] Divorced / Language: English / Race: White Male Date of Admission: 11/10/2015 CC: Right Knee Pain History of Present Illness The patient is a 75 year old male who comes in for a preoperative History and Physical. The patient is scheduled for a right total knee arthroplasty to be performed by Dr. Dione Plover. Aluisio, MD at [facility] on 11-10-2015. The patient is a 75 year old male who presented with knee complaints. The patient was seen in referral from [name], at [facility]. The patient reports left knee and right knee symptoms including: pain, soreness and grinding which began year(s) ago without any known injury.The patient feels that the symptoms are worsening. The patient has the current diagnosis of knee osteoarthritis. Prior to being seen the patient was previously evaluated by a colleague (Dr. [name]) 6 year(s) ago. Previous work-up for this problem has included knee x-rays. Past treatment for this problem has included intra-articular injection of corticosteroids (also had a series of Supartz), nonsteroidal anti-inflammatory drugs (Naproxen) and opioid analgesics (Tramadol). He just retired a few years ago. He reports that he has had a major change in lifestyle since his retirement. He has cut back on his diet, and has gradually lost about 20 pounds. He reports that he has discussed possibly having surgery with several friends or associates. He has had known arthritis for quite some time now. 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 Over the years and with the standing and twisting on his knees, he just had some increasing pain. He has been told he needed knee replacements in the past, but he has put it off. Over the past four years, he has lost about 30 pounds and also during his retirement, he  has not been as active as he was or stressful on the knees as he was when he was standing, working 12 and 13 hour shifts as a Software engineer. So, the knees have been a little better, but they have had some increasing pain. The left knee seems to be more symptomatic and problematic than the right. The right is not as bad and he does not have as much difficulty with it, but the left knee has been giving some issues. This past June, he was set up for his annual physical and he had the house call RN come by. She recommended that he see Dr. [name] because he was thinking about getting in replacements done. He tells me he has had a history of Supartz injection back in March of 2011. He did well with the bilateral gel shots and this was done by Dr. [name]. He did not have any significant pain at rest, but there is some pain at night. It is mostly with weightbearing. He can get in and out of the car pretty well, but going up and down steps and the flexion that tends to give him a little bit more of an issue. He had the left knee repalced back in January of this year and had issues with bleeding, swelling, and blistering following the procedure. It was quite difficult for the first week following the surgery but by the second week he was doing great and has continued to improve. He is now at a point where he would like to proceed with the opposite right knee at this time. They have  been treated conservatively in the past for the above stated problem and despite conservative measures, they continue to have progressive pain and severe functional limitations and dysfunction. They have failed non-operative management including home exercise, medications, and injections. It is felt that they would benefit from undergoing total joint replacement. Risks and benefits of the procedure have been discussed with the patient and they elect to proceed with surgery. There are no active contraindications to surgery such as ongoing  infection or rapidly progressive neurological disease.   Problem List/Past Medical Primary osteoarthritis of right knee (M17.11)  Gout  High blood pressure  Osteoarthritis  Osteoporosis  Hypercholesterolemia  Anxiety Disorder  Allergies  Latex Exam Gloves *MEDICAL DEVICES AND SUPPLIES*  Allergies Reconciled  Xarelto *ANTICOAGULANTS*  bleeding and swelling  Family History  Family history unknown - Adopted  First Degree Relatives  reported  Social History Tobacco use  Never smoker. 01/30/2015 Current drinker  01/30/2015: Currently drinks beer only occasionally per week Marital status  divorced No history of drug/alcohol rehab  Not under pain contract  Number of flights of stairs before winded  2-3 Tobacco / smoke exposure  01/30/2015: no Most recent primary occupation  pharmacist; retired Living situation  Lives alone. [place] with caregiver following the Right TKA.  Medication History  Allopurinol (300MG  Tablet, Oral daily) Active. Acyclovir (400MG  Tablet, Oral two times daily) Active. Tramadol Active. AmLODIPine Besylate (5MG  Tablet, Oral daily) Active. ALPRAZolam (0.5MG  Tablet, Oral three times daily) Active. Simvastatin (20MG  Tablet, Oral daily) Active. Benazepril HCl (40MG  Tablet, Oral daily) Active. Methocarbamol (500MG  Tablet, Oral at bedtime) Active. Vitamin C (Oral) Specific strength unknown - Active. Aspirin (81MG  Tablet DR, Oral) Active. Multivitamin (Oral) Active. CoQ10 (Oral) Specific strength unknown - Active. Vitamin D (400UNIT Tablet, Oral) Active.  Past Surgical History  Rotator Cuff Repair  right Tonsillectomy  Vasectomy  Umbilical Hernia Repair     Review of Systems General Not Present- Chills, Fatigue, Fever, Memory Loss, Night Sweats, Weight Gain and Weight Loss. Skin Not Present- Eczema, Hives, Itching, Lesions and Rash. HEENT Not  Present- Dentures, Double Vision, Headache, Hearing Loss, Tinnitus and Visual Loss. Respiratory Not Present- Allergies, Chronic Cough, Coughing up blood, Shortness of breath at rest and Shortness of breath with exertion. Cardiovascular Not Present- Chest Pain, Difficulty Breathing Lying Down, Murmur, Palpitations, Racing/skipping heartbeats and Swelling. Gastrointestinal Not Present- Abdominal Pain, Bloody Stool, Constipation, Diarrhea, Difficulty Swallowing, Heartburn, Jaundice, Loss of appetitie, Nausea and Vomiting. Male Genitourinary Not Present- Blood in Urine, Discharge, Flank Pain, Incontinence, Painful Urination, Urgency, Urinary frequency, Urinary Retention, Urinating at Night and Weak urinary stream. Musculoskeletal Present- Back Pain (lower back) and Joint Pain (right knee). Not Present- Joint Swelling, Morning Stiffness, Muscle Pain, Muscle Weakness and Spasms. Neurological Not Present- Blackout spells, Difficulty with balance, Dizziness, Paralysis, Tremor and Weakness. Psychiatric Not Present- Insomnia.   Physical Exam  The physical exam findings are as follows: Note:There is still a little bit subcutaneous swelling in the left knee, but it has gone down significantly. His range of motion is about 0 to 125. There is no tenderness or instability.  General Mental Status -Alert, cooperative and good historian. General Appearance-pleasant, Not in acute distress. Orientation-Oriented X3. [place], Well nourished and Well developed.  Head and Neck Head-normocephalic, atraumatic . Neck Global Assessment - supple, no bruit auscultated on the right, no bruit auscultated on the left.  Eye Vision-Wears corrective lenses. Pupil - Bilateral-Regular and Round. Motion - Bilateral-EOMI.  Chest and Lung Exam Auscultation Breath sounds - clear at anterior chest wall and clear at posterior chest wall. Adventitious sounds - No Adventitious  sounds.  Cardiovascular Auscultation Rhythm - Regular rate and rhythm. Heart Sounds - S1 WNL and S2 WNL. Murmurs & Other Heart Sounds - Auscultation of the heart reveals - No Murmurs.  Abdomen Palpation/Percussion Tenderness - Abdomen is non-tender to palpation. Rigidity (guarding) - Abdomen is soft. Auscultation Auscultation of the abdomen reveals - Bowel sounds normal.  Male Genitourinary Note: Not done, not pertinent to present illness   Musculoskeletal Note: The patient is a 75 year old tall, thin frame, well-nourished, well-developed male, excellent historian. Both knees are examined. Right knee shows a little bit of a contracture about 5 degree with flexion back to 135. The knee is stable with varus and valgus stressing. Essentially nontender on exam today, very minimal if there is malalignment. Moderate crepitus is noted. Left knee shows there is still a little bit subcutaneous swelling in the left knee, but it has gone down significantly. His range of motion is about 0 to 125. There is no tenderness or instability.  RADIOGRAPHS X-rays: The right knee, the more asymptomatic knee, does show already bone on bone in the medial compartment, fairly well preserved lateral compartment. The lateral view of the right knee shows narrowing behind the patellofemoral compartment with some posterior femoral spurring and some early posterior patella spurring. AP and lateral of the left knee show prosthesis in excellent position with no periprosthetic abnormalities.   Assessment & Plan  Status post total left knee replacement [id number]) Primary osteoarthritis of right knee (M17.11)  Note:Surgical Plans: Right Total Knee Replacement  Disposition: Home  PCP: Dr. Chapman Fitch  IV TXA  Anesthesia Issues: None  Signed electronically by Ok Edwards, III PA-C

## 2015-10-14 NOTE — H&P (Signed)
Kevork Bownds Pandey DOB: [birth year] Divorced / Language: English / Race: White Male Date of Admission:  11/10/2015 CC:  Right Knee Pain History of Present Illness The patient is a 75 year old male who comes in for a preoperative History and Physical. The patient is scheduled for a right total knee arthroplasty to be performed by Dr. Dione Plover. Aluisio, MD at [facility] on 11-10-2015. The patient is a 75 year old male who presented with knee complaints. The patient was seen in referral from [name], at [facility]. The patient reports left knee and right knee symptoms including: pain, soreness and grinding which began year(s) ago without any known injury.The patient feels that the symptoms are worsening. The patient has the current diagnosis of knee osteoarthritis. Prior to being seen the patient was previously evaluated by a colleague (Dr. [name]) 6 year(s) ago. Previous work-up for this problem has included knee x-rays. Past treatment for this problem has included intra-articular injection of corticosteroids (also had a series of Supartz), nonsteroidal anti-inflammatory drugs (Naproxen) and opioid analgesics (Tramadol). He just retired a few years ago. He reports that he has had a major change in lifestyle since his retirement. He has cut back on his diet, and has gradually lost about 20 pounds. He reports that he has discussed possibly having surgery with several friends or associates. He has had known arthritis for quite some time now. 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 Over the years and with the standing and twisting on his knees, he just had some increasing pain. He has been told he needed knee replacements in the past, but he has put it off. Over the past four years, he has lost about 30 pounds and also during his retirement, he  has not been as active as he was or stressful on the knees as he was when he was standing, working 12 and 13 hour shifts as a Software engineer. So, the knees have been a little better, but they have had some increasing pain. The left knee seems to be more symptomatic and problematic than the right. The right is not as bad and he does not have as much difficulty with it, but the left knee has been giving some issues. This past June, he was set up for his annual physical and he had the house call RN come by. She recommended that he see Dr. [name] because he was thinking about getting in replacements done. He tells me he has had a history of Supartz injection back in March of 2011. He did well with the bilateral gel shots and this was done by Dr. [name]. He did not have any significant pain at rest, but there is some pain at night. It is mostly with weightbearing. He can get in and out of the car pretty well, but going up and down steps and the flexion that tends to give him a little bit more of an issue. He had the left knee repalced back in January of this year and had issues with bleeding, swelling, and blistering following the procedure. It was quite difficult for the first week following the surgery but by the second week he was doing great and has continued to improve. He is now at a point where he would like to proceed with the opposite right knee at this time.  They have been treated conservatively in the past for the above stated problem and despite conservative measures, they continue to have progressive pain and severe functional limitations and dysfunction. They have failed non-operative management including home exercise, medications, and injections. It is felt that they would benefit from undergoing total joint replacement. Risks and benefits of the procedure have been discussed with the patient and they elect to proceed with surgery. There are no active contraindications to surgery such as ongoing  infection or rapidly progressive neurological disease.   Problem List/Past Medical Primary osteoarthritis of right knee (M17.11)  Gout  High blood pressure  Osteoarthritis  Osteoporosis  Hypercholesterolemia  Anxiety Disorder  Allergies  Latex Exam Gloves *MEDICAL DEVICES AND SUPPLIES*  Allergies Reconciled  Xarelto *ANTICOAGULANTS*  bleeding and swelling  Family History  Family history unknown - Adopted  First Degree Relatives  reported  Social History Tobacco use  Never smoker. 01/30/2015 Current drinker  01/30/2015: Currently drinks beer only occasionally per week Marital status  divorced No history of drug/alcohol rehab  Not under pain contract  Number of flights of stairs before winded  2-3 Tobacco / smoke exposure  01/30/2015: no Most recent primary occupation  pharmacist; retired Living situation  Lives alone. [place] with caregiver following the Right TKA.  Medication History  Allopurinol (300MG  Tablet, Oral daily) Active. Acyclovir (400MG  Tablet, Oral two times daily) Active. Tramadol Active. AmLODIPine Besylate (5MG  Tablet, Oral daily) Active. ALPRAZolam (0.5MG  Tablet, Oral three times daily) Active. Simvastatin (20MG  Tablet, Oral daily) Active. Benazepril HCl (40MG  Tablet, Oral daily) Active. Methocarbamol (500MG  Tablet, Oral at bedtime) Active. Vitamin C (Oral) Specific strength unknown - Active. Aspirin (81MG  Tablet DR, Oral) Active. Multivitamin (Oral) Active. CoQ10 (Oral) Specific strength unknown - Active. Vitamin D (400UNIT Tablet, Oral) Active.  Past Surgical History  Rotator Cuff Repair  right Tonsillectomy  Vasectomy  Umbilical Hernia Repair     Review of Systems General Not Present- Chills, Fatigue, Fever, Memory Loss, Night Sweats, Weight Gain and Weight Loss. Skin Not Present- Eczema, Hives, Itching, Lesions and Rash. HEENT Not  Present- Dentures, Double Vision, Headache, Hearing Loss, Tinnitus and Visual Loss. Respiratory Not Present- Allergies, Chronic Cough, Coughing up blood, Shortness of breath at rest and Shortness of breath with exertion. Cardiovascular Not Present- Chest Pain, Difficulty Breathing Lying Down, Murmur, Palpitations, Racing/skipping heartbeats and Swelling. Gastrointestinal Not Present- Abdominal Pain, Bloody Stool, Constipation, Diarrhea, Difficulty Swallowing, Heartburn, Jaundice, Loss of appetitie, Nausea and Vomiting. Male Genitourinary Not Present- Blood in Urine, Discharge, Flank Pain, Incontinence, Painful Urination, Urgency, Urinary frequency, Urinary Retention, Urinating at Night and Weak urinary stream. Musculoskeletal Present- Back Pain (lower back) and Joint Pain (right knee). Not Present- Joint Swelling, Morning Stiffness, Muscle Pain, Muscle Weakness and Spasms. Neurological Not Present- Blackout spells, Difficulty with balance, Dizziness, Paralysis, Tremor and Weakness. Psychiatric Not Present- Insomnia.   Physical Exam  The physical exam findings are as follows: Note:There is still a little bit subcutaneous swelling in the left knee, but it has gone down significantly. His range of motion is about 0 to 125. There is no tenderness or instability.  General Mental Status -Alert, cooperative and good historian. General Appearance-pleasant, Not in acute distress. Orientation-Oriented X3. Independence, Well nourished and Well developed.  Head and Neck Head-normocephalic, atraumatic . Neck Global Assessment - supple, no bruit auscultated on the right, no bruit auscultated on the left.  Eye Vision-Wears corrective lenses. Pupil - Bilateral-Regular and Round. Motion -  Bilateral-EOMI.  Chest and Lung Exam Auscultation Breath sounds - clear at anterior chest wall and clear at posterior chest wall. Adventitious sounds - No Adventitious  sounds.  Cardiovascular Auscultation Rhythm - Regular rate and rhythm. Heart Sounds - S1 WNL and S2 WNL. Murmurs & Other Heart Sounds - Auscultation of the heart reveals - No Murmurs.  Abdomen Palpation/Percussion Tenderness - Abdomen is non-tender to palpation. Rigidity (guarding) - Abdomen is soft. Auscultation Auscultation of the abdomen reveals - Bowel sounds normal.  Male Genitourinary Note: Not done, not pertinent to present illness   Musculoskeletal Note: The patient is a 75 year old tall, thin frame, well-nourished, well-developed male, excellent historian. Both knees are examined. Right knee shows a little bit of a contracture about 5 degree with flexion back to 135. The knee is stable with varus and valgus stressing. Essentially nontender on exam today, very minimal if there is malalignment. Moderate crepitus is noted. Left knee shows there is still a little bit subcutaneous swelling in the left knee, but it has gone down significantly. His range of motion is about 0 to 125. There is no tenderness or instability.  RADIOGRAPHS X-rays: The right knee, the more asymptomatic knee, does show already bone on bone in the medial compartment, fairly well preserved lateral compartment. The lateral view of the right knee shows narrowing behind the patellofemoral compartment with some posterior femoral spurring and some early posterior patella spurring. AP and lateral of the left knee show prosthesis in excellent position with no periprosthetic abnormalities.   Assessment & Plan  Status post total left knee replacement [id number]) Primary osteoarthritis of right knee (M17.11)  Note:Surgical Plans: Right Total Knee Replacement  Disposition: Home  PCP: Dr. Chapman Fitch  IV TXA  Anesthesia Issues: None  Signed electronically by Ok Edwards, III PA-C

## 2015-10-23 ENCOUNTER — Ambulatory Visit: Payer: Self-pay | Admitting: Orthopedic Surgery

## 2015-10-23 NOTE — Progress Notes (Signed)
Preoperative surgical orders have been place into the Epic hospital system for Shaun Carey on 10/23/2015, 10:42 AM  by Mickel Crow for surgery on 11-10-15.  Preop Total Knee orders including Experal, IV Tylenol, and IV Decadron as long as there are no contraindications to the above medications. Arlee Muslim, PA-C

## 2015-10-31 ENCOUNTER — Encounter (HOSPITAL_COMMUNITY): Payer: Self-pay

## 2015-10-31 ENCOUNTER — Encounter (HOSPITAL_COMMUNITY)
Admission: RE | Admit: 2015-10-31 | Discharge: 2015-10-31 | Disposition: A | Discharge status: Home / Self Care | Payer: Medicare Other | Source: Ambulatory Visit | Attending: Orthopedic Surgery | Admitting: Orthopedic Surgery

## 2015-10-31 DIAGNOSIS — Z01812 Encounter for preprocedural laboratory examination: Secondary | ICD-10-CM | POA: Diagnosis not present

## 2015-10-31 HISTORY — DX: Pure hypercholesterolemia, unspecified: E78.00

## 2015-10-31 HISTORY — DX: Insomnia, unspecified: G47.00

## 2015-10-31 HISTORY — DX: Anesthesia of skin: R20.0

## 2015-10-31 HISTORY — DX: Personal history of other medical treatment: Z92.89

## 2015-10-31 HISTORY — DX: Presence of spectacles and contact lenses: Z97.3

## 2015-10-31 HISTORY — DX: Anesthesia of skin: R20.2

## 2015-10-31 LAB — COMPREHENSIVE METABOLIC PANEL
ALT: 15 U/L — AB (ref 17–63)
ANION GAP: 10 (ref 5–15)
AST: 25 U/L (ref 15–41)
Albumin: 4.5 g/dL (ref 3.5–5.0)
Alkaline Phosphatase: 131 U/L — ABNORMAL HIGH (ref 38–126)
BUN: 12 mg/dL (ref 6–20)
CALCIUM: 10 mg/dL (ref 8.9–10.3)
CHLORIDE: 104 mmol/L (ref 101–111)
CO2: 27 mmol/L (ref 22–32)
CREATININE: 0.7 mg/dL (ref 0.61–1.24)
Glucose, Bld: 115 mg/dL — ABNORMAL HIGH (ref 65–99)
Potassium: 5.1 mmol/L (ref 3.5–5.1)
SODIUM: 141 mmol/L (ref 135–145)
Total Bilirubin: 0.6 mg/dL (ref 0.3–1.2)
Total Protein: 7.7 g/dL (ref 6.5–8.1)

## 2015-10-31 LAB — URINALYSIS, ROUTINE W REFLEX MICROSCOPIC
Bilirubin Urine: NEGATIVE
GLUCOSE, UA: NEGATIVE mg/dL
HGB URINE DIPSTICK: NEGATIVE
Ketones, ur: NEGATIVE mg/dL
LEUKOCYTES UA: NEGATIVE
Nitrite: NEGATIVE
PH: 6 (ref 5.0–8.0)
Protein, ur: NEGATIVE mg/dL
Specific Gravity, Urine: 1.015 (ref 1.005–1.030)

## 2015-10-31 LAB — CBC
HCT: 41.5 % (ref 39.0–52.0)
Hemoglobin: 13.8 g/dL (ref 13.0–17.0)
MCH: 28 pg (ref 26.0–34.0)
MCHC: 33.3 g/dL (ref 30.0–36.0)
MCV: 84.2 fL (ref 78.0–100.0)
PLATELETS: 182 10*3/uL (ref 150–400)
RBC: 4.93 MIL/uL (ref 4.22–5.81)
RDW: 15.8 % — ABNORMAL HIGH (ref 11.5–15.5)
WBC: 10 10*3/uL (ref 4.0–10.5)

## 2015-10-31 LAB — SURGICAL PCR SCREEN
MRSA, PCR: INVALID — AB
Staphylococcus aureus: INVALID — AB

## 2015-10-31 LAB — PROTIME-INR
INR: 1.02 (ref 0.00–1.49)
PROTHROMBIN TIME: 13.6 s (ref 11.6–15.2)

## 2015-10-31 LAB — APTT: aPTT: 30 seconds (ref 24–37)

## 2015-10-31 NOTE — Progress Notes (Signed)
Clearance note per chart per Dr Chapman Fitch 12/27/2014 POA copy per chart

## 2015-10-31 NOTE — Patient Instructions (Signed)
Shaun Carey  10/31/2015   Your procedure is scheduled on: Monday Nov 10, 2015  Report to [facility]  Entrance take [facility]  elevators to 3rd floor to  [place] at 9:30 AM.  Call this number if you have problems the morning of surgery [phone]   Remember: ONLY 1 PERSON MAY GO WITH YOU TO SHORT STAY TO GET  READY MORNING OF [place].  Do not eat food or drink liquids :After Midnight.     Take these medicines the morning of surgery with A SIP OF WATER: Acyclovir, Alprazolam if needed; Amlodipine; Allopurinol                                You may not have any metal on your body including hair pins and              piercings  Do not wear jewelry,  lotions, powders or colognes, deodorant                        Men may shave face and neck.   Do not bring valuables to the hospital. [place].  Contacts, dentures or bridgework may not be worn into surgery.  Leave suitcase in the car. After surgery it may be brought to your room.              Please read over the following fact sheets you were given:MRSA INFORMATION SHEET; INCENTIVE SPIROMETER; BLOOD TRANSFUSION INFORMATION SHEET  _____________________________________________________________________             [facility] - Preparing for Surgery Before surgery, you can play an important role.  Because skin is not sterile, your skin needs to be as free of germs as possible.  You can reduce the number of germs on your skin by washing with CHG (chlorahexidine gluconate) soap before surgery.  CHG is an antiseptic cleaner which kills germs and bonds with the skin to continue killing germs even after washing. Please DO NOT use if you have an allergy to CHG or antibacterial soaps.  If your skin becomes reddened/irritated stop using the CHG and inform your nurse when you arrive at Short Stay. Do not shave (including legs and underarms) for at least 48  hours prior to the first CHG shower.  You may shave your face/neck. Please follow these instructions carefully:  1.  Shower with CHG Soap the night before surgery and the  morning of Surgery.  2.  If you choose to wash your hair, wash your hair first as usual with your  normal  shampoo.  3.  After you shampoo, rinse your hair and body thoroughly to remove the  shampoo.                           4.  Use CHG as you would any other liquid soap.  You can apply chg directly  to the skin and wash                       Gently with a scrungie or clean washcloth.  5.  Apply the CHG Soap to your body ONLY  FROM THE NECK DOWN.   Do not use on face/ open                           Wound or open sores. Avoid contact with eyes, ears mouth and genitals (private parts).                       Wash face,  Genitals (private parts) with your normal soap.             6.  Wash thoroughly, paying special attention to the area where your surgery  will be performed.  7.  Thoroughly rinse your body with warm water from the neck down.  8.  DO NOT shower/wash with your normal soap after using and rinsing off  the CHG Soap.                9.  Pat yourself dry with a clean towel.            10.  Wear clean pajamas.            11.  Place clean sheets on your bed the night of your first shower and do not  sleep with pets. Day of Surgery : Do not apply any lotions/deodorants the morning of surgery.  Please wear clean clothes to the hospital/surgery center.  FAILURE TO FOLLOW THESE INSTRUCTIONS MAY RESULT IN THE CANCELLATION OF YOUR SURGERY PATIENT SIGNATURE_________________________________  NURSE SIGNATURE__________________________________  ________________________________________________________________________   [name]  An incentive spirometer is a tool that can help keep your lungs clear and active. This tool measures how well you are filling your lungs with each breath. Taking long deep breaths may help  reverse or decrease the chance of developing breathing (pulmonary) problems (especially infection) following:  A long period of time when you are unable to move or be active. BEFORE THE PROCEDURE   If the spirometer includes an indicator to show your best effort, your nurse or respiratory therapist will set it to a desired goal.  If possible, sit up straight or lean slightly forward. Try not to slouch.  Hold the incentive spirometer in an upright position. INSTRUCTIONS FOR USE   Sit on the edge of your bed if possible, or sit up as far as you can in bed or on a chair.  Hold the incentive spirometer in an upright position.  Breathe out normally.  Place the mouthpiece in your mouth and seal your lips tightly around it.  Breathe in slowly and as deeply as possible, raising the piston or the ball toward the top of the column.  Hold your breath for 3-5 seconds or for as long as possible. Allow the piston or ball to fall to the bottom of the column.  Remove the mouthpiece from your mouth and breathe out normally.  Rest for a few seconds and repeat Steps 1 through 7 at least 10 times every 1-2 hours when you are awake. Take your time and take a few normal breaths between deep breaths.  The spirometer may include an indicator to show your best effort. Use the indicator as a goal to work toward during each repetition.  After each set of 10 deep breaths, practice coughing to be sure your lungs are clear. If you have an incision (the cut made at the time of surgery), support your incision when coughing by placing a pillow or rolled up towels firmly against it. Once you  are able to get out of bed, walk around indoors and cough well. You may stop using the incentive spirometer when instructed by your caregiver.  RISKS AND COMPLICATIONS  Take your time so you do not get dizzy or light-headed.  If you are in pain, you may need to take or ask for pain medication before doing incentive spirometry.  It is harder to take a deep breath if you are having pain. AFTER USE  Rest and breathe slowly and easily.  It can be helpful to keep track of a log of your progress. Your caregiver can provide you with a simple table to help with this. If you are using the spirometer at home, follow these instructions: [place] IF:   You are having difficultly using the spirometer.  You have trouble using the spirometer as often as instructed.  Your pain medication is not giving enough relief while using the spirometer.  You develop fever of 100.5 F (38.1 C) or higher. SEEK IMMEDIATE MEDICAL CARE IF:   You cough up bloody sputum that had not been present before.  You develop fever of 102 F (38.9 C) or greater.  You develop worsening pain at or near the incision site. MAKE SURE YOU:   Understand these instructions.  Will watch your condition.  Will get help right away if you are not doing well or get worse. Document Released: 10/18/2006 Document Revised: 08/30/2011 Document Reviewed: 12/19/2006 ExitCare Patient Information 2014 ExitCare, [place].   ________________________________________________________________________  WHAT IS A BLOOD TRANSFUSION? Blood Transfusion Information  A transfusion is the replacement of blood or some of its parts. Blood is made up of multiple cells which provide different functions.  Red blood cells carry oxygen and are used for blood loss replacement.  White blood cells fight against infection.  Platelets control bleeding.  Plasma helps clot blood.  Other blood products are available for specialized needs, such as hemophilia or other clotting disorders. BEFORE THE TRANSFUSION  Who gives blood for transfusions?   Healthy volunteers who are fully evaluated to make sure their blood is safe. This is blood bank blood. Transfusion therapy is the safest it has ever been in the practice of medicine. Before blood is taken from a donor, a complete  history is taken to make sure that person has no history of diseases nor engages in risky social behavior (examples are intravenous drug use or sexual activity with multiple partners). The donor's travel history is screened to minimize risk of transmitting infections, such as malaria. The donated blood is tested for signs of infectious diseases, such as HIV and hepatitis. The blood is then tested to be sure it is compatible with you in order to minimize the chance of a transfusion reaction. If you or a relative donates blood, this is often done in anticipation of surgery and is not appropriate for emergency situations. It takes many days to process the donated blood. RISKS AND COMPLICATIONS Although transfusion therapy is very safe and saves many lives, the main dangers of transfusion include:   Getting an infectious disease.  Developing a transfusion reaction. This is an allergic reaction to something in the blood you were given. Every precaution is taken to prevent this. The decision to have a blood transfusion has been considered carefully by your caregiver before blood is given. Blood is not given unless the benefits outweigh the risks. AFTER THE TRANSFUSION  Right after receiving a blood transfusion, you will usually feel much better and more energetic. This is  especially true if your red blood cells have gotten low (anemic). The transfusion raises the level of the red blood cells which carry oxygen, and this usually causes an energy increase.  The nurse administering the transfusion will monitor you carefully for complications. HOME CARE INSTRUCTIONS  No special instructions are needed after a transfusion. You may find your energy is better. Speak with your caregiver about any limitations on activity for underlying diseases you may have. SEEK MEDICAL CARE IF:   Your condition is not improving after your transfusion.  You develop redness or irritation at the intravenous (IV) site. SEEK  IMMEDIATE MEDICAL CARE IF:  Any of the following symptoms occur over the next 12 hours:  Shaking chills.  You have a temperature by mouth above 102 F (38.9 C), not controlled by medicine.  Chest, back, or muscle pain.  People around you feel you are not acting correctly or are confused.  Shortness of breath or difficulty breathing.  Dizziness and fainting.  You get a rash or develop hives.  You have a decrease in urine output.  Your urine turns a dark color or changes to pink, red, or brown. Any of the following symptoms occur over the next 10 days:  You have a temperature by mouth above 102 F (38.9 C), not controlled by medicine.  Shortness of breath.  Weakness after normal activity.  The white part of the eye turns yellow (jaundice).  You have a decrease in the amount of urine or are urinating less often.  Your urine turns a dark color or changes to pink, red, or brown. Document Released: 06/04/2000 Document Revised: 08/30/2011 Document Reviewed: 01/22/2008 [facility] Patient Information 2014 [place], [place].  _______________________________________________________________________

## 2015-11-02 LAB — MRSA CULTURE

## 2015-11-10 ENCOUNTER — Encounter (HOSPITAL_COMMUNITY): Payer: Self-pay | Admitting: *Deleted

## 2015-11-10 ENCOUNTER — Inpatient Hospital Stay (HOSPITAL_COMMUNITY): Payer: Medicare Other | Admitting: Anesthesiology

## 2015-11-10 ENCOUNTER — Inpatient Hospital Stay (HOSPITAL_COMMUNITY)
Admission: RE | Admit: 2015-11-10 | Discharge: 2015-11-12 | DRG: 470 | Disposition: A | Discharge status: Home Health | Payer: Medicare Other | Source: Ambulatory Visit | Attending: Orthopedic Surgery | Admitting: Orthopedic Surgery

## 2015-11-10 ENCOUNTER — Encounter (HOSPITAL_COMMUNITY)
Admission: RE | Disposition: A | Discharge status: Home Health | Payer: Self-pay | Source: Ambulatory Visit | Attending: Orthopedic Surgery

## 2015-11-10 DIAGNOSIS — M25561 Pain in right knee: Secondary | ICD-10-CM | POA: Diagnosis present

## 2015-11-10 DIAGNOSIS — Z8614 Personal history of Methicillin resistant Staphylococcus aureus infection: Secondary | ICD-10-CM | POA: Diagnosis not present

## 2015-11-10 DIAGNOSIS — M179 Osteoarthritis of knee, unspecified: Secondary | ICD-10-CM | POA: Diagnosis present

## 2015-11-10 DIAGNOSIS — Z79899 Other long term (current) drug therapy: Secondary | ICD-10-CM | POA: Diagnosis not present

## 2015-11-10 DIAGNOSIS — E785 Hyperlipidemia, unspecified: Secondary | ICD-10-CM | POA: Diagnosis present

## 2015-11-10 DIAGNOSIS — M1710 Unilateral primary osteoarthritis, unspecified knee: Secondary | ICD-10-CM | POA: Diagnosis present

## 2015-11-10 DIAGNOSIS — M810 Age-related osteoporosis without current pathological fracture: Secondary | ICD-10-CM | POA: Diagnosis present

## 2015-11-10 DIAGNOSIS — I10 Essential (primary) hypertension: Secondary | ICD-10-CM | POA: Diagnosis present

## 2015-11-10 DIAGNOSIS — M1711 Unilateral primary osteoarthritis, right knee: Secondary | ICD-10-CM | POA: Diagnosis present

## 2015-11-10 DIAGNOSIS — Z7982 Long term (current) use of aspirin: Secondary | ICD-10-CM

## 2015-11-10 HISTORY — PX: TOTAL KNEE ARTHROPLASTY: SHX125

## 2015-11-10 SURGERY — ARTHROPLASTY, KNEE, TOTAL
Anesthesia: General | Site: Knee | Laterality: Right

## 2015-11-10 MED ORDER — LACTATED RINGERS IV SOLN
INTRAVENOUS | Status: DC
  Administered 2015-11-10: 1000 mL via INTRAVENOUS

## 2015-11-10 MED ORDER — SODIUM CHLORIDE 0.9 % IV SOLN
INTRAVENOUS | Status: DC
  Administered 2015-11-10: 14:00:00 via INTRAVENOUS

## 2015-11-10 MED ORDER — ACYCLOVIR 400 MG PO TABS
400.0000 mg | ORAL_TABLET | Freq: Two times a day (BID) | ORAL | Status: DC
  Administered 2015-11-10 – 2015-11-12 (×4): 400 mg via ORAL
  Filled 2015-11-10 (×4): qty 1

## 2015-11-10 MED ORDER — HYDROMORPHONE HCL 1 MG/ML IJ SOLN
0.2500 mg | INTRAMUSCULAR | Status: DC | PRN
  Administered 2015-11-10 (×2): 0.5 mg via INTRAVENOUS

## 2015-11-10 MED ORDER — CEFAZOLIN SODIUM-DEXTROSE 2-4 GM/100ML-% IV SOLN
2.0000 g | Freq: Four times a day (QID) | INTRAVENOUS | Status: AC
  Administered 2015-11-10 – 2015-11-11 (×2): 2 g via INTRAVENOUS
  Filled 2015-11-10 (×2): qty 100

## 2015-11-10 MED ORDER — FENTANYL CITRATE (PF) 100 MCG/2ML IJ SOLN
INTRAMUSCULAR | Status: DC | PRN
  Administered 2015-11-10 (×2): 50 ug via INTRAVENOUS
  Administered 2015-11-10: 100 ug via INTRAVENOUS

## 2015-11-10 MED ORDER — ROCURONIUM BROMIDE 100 MG/10ML IV SOLN
INTRAVENOUS | Status: DC | PRN
  Administered 2015-11-10: 50 mg via INTRAVENOUS

## 2015-11-10 MED ORDER — MENTHOL 3 MG MT LOZG: 1.0000 | LOZENGE | OROMUCOSAL | Status: DC | PRN

## 2015-11-10 MED ORDER — ALPRAZOLAM 0.5 MG PO TABS
0.5000 mg | ORAL_TABLET | Freq: Three times a day (TID) | ORAL | Status: DC | PRN
  Administered 2015-11-10 – 2015-11-11 (×2): 0.5 mg via ORAL
  Filled 2015-11-10 (×2): qty 1

## 2015-11-10 MED ORDER — BUPIVACAINE HCL 0.25 % IJ SOLN
INTRAMUSCULAR | Status: DC | PRN
Start: 2015-11-10 — End: 2015-11-10
  Administered 2015-11-10: 20 mL

## 2015-11-10 MED ORDER — DEXAMETHASONE SODIUM PHOSPHATE 10 MG/ML IJ SOLN
10.0000 mg | Freq: Once | INTRAMUSCULAR | Status: AC
  Administered 2015-11-10: 10 mg via INTRAVENOUS

## 2015-11-10 MED ORDER — ACETAMINOPHEN 650 MG RE SUPP: 650.0000 mg | Freq: Four times a day (QID) | RECTAL | Status: DC | PRN

## 2015-11-10 MED ORDER — FLUTICASONE PROPIONATE 50 MCG/ACT NA SUSP
2.0000 | Freq: Every day | NASAL | Status: DC | PRN
  Filled 2015-11-10: qty 16

## 2015-11-10 MED ORDER — HYDROMORPHONE HCL 2 MG PO TABS
2.0000 mg | ORAL_TABLET | ORAL | Status: DC | PRN
  Administered 2015-11-10: 2 mg via ORAL
  Administered 2015-11-10 – 2015-11-12 (×10): 4 mg via ORAL
  Filled 2015-11-10 (×9): qty 2
  Filled 2015-11-10: qty 1
  Filled 2015-11-10: qty 2

## 2015-11-10 MED ORDER — PROPOFOL 10 MG/ML IV BOLUS
INTRAVENOUS | Status: AC
  Filled 2015-11-10: qty 20

## 2015-11-10 MED ORDER — ASPIRIN EC 325 MG PO TBEC
325.0000 mg | DELAYED_RELEASE_TABLET | Freq: Two times a day (BID) | ORAL | Status: DC
  Administered 2015-11-11 – 2015-11-12 (×3): 325 mg via ORAL
  Filled 2015-11-10 (×3): qty 1

## 2015-11-10 MED ORDER — ALLOPURINOL 300 MG PO TABS
300.0000 mg | ORAL_TABLET | Freq: Every day | ORAL | Status: DC
  Administered 2015-11-11 – 2015-11-12 (×2): 300 mg via ORAL
  Filled 2015-11-10 (×2): qty 1

## 2015-11-10 MED ORDER — FLEET ENEMA 7-19 GM/118ML RE ENEM: 1.0000 | ENEMA | Freq: Once | RECTAL | Status: DC | PRN

## 2015-11-10 MED ORDER — ACETAMINOPHEN 325 MG PO TABS
650.0000 mg | ORAL_TABLET | Freq: Four times a day (QID) | ORAL | Status: DC | PRN
  Administered 2015-11-12: 650 mg via ORAL
  Filled 2015-11-10: qty 2

## 2015-11-10 MED ORDER — FENTANYL CITRATE (PF) 100 MCG/2ML IJ SOLN
INTRAMUSCULAR | Status: AC
  Filled 2015-11-10: qty 2

## 2015-11-10 MED ORDER — CHLORHEXIDINE GLUCONATE 4 % EX LIQD: 60.0000 mL | Freq: Once | CUTANEOUS | Status: DC

## 2015-11-10 MED ORDER — LACTATED RINGERS IV SOLN
INTRAVENOUS | Status: DC | PRN
  Administered 2015-11-10 (×2): via INTRAVENOUS

## 2015-11-10 MED ORDER — POLYETHYLENE GLYCOL 3350 17 G PO PACK: 17.0000 g | PACK | Freq: Every day | ORAL | Status: DC | PRN

## 2015-11-10 MED ORDER — MIDAZOLAM HCL 2 MG/2ML IJ SOLN
INTRAMUSCULAR | Status: AC
  Filled 2015-11-10: qty 2

## 2015-11-10 MED ORDER — CEFAZOLIN SODIUM-DEXTROSE 2-4 GM/100ML-% IV SOLN
2.0000 g | INTRAVENOUS | Status: AC
  Administered 2015-11-10: 2 g via INTRAVENOUS

## 2015-11-10 MED ORDER — ONDANSETRON HCL 4 MG PO TABS: 4.0000 mg | ORAL_TABLET | Freq: Four times a day (QID) | ORAL | Status: DC | PRN

## 2015-11-10 MED ORDER — PROPOFOL 10 MG/ML IV BOLUS
INTRAVENOUS | Status: AC
  Filled 2015-11-10: qty 40

## 2015-11-10 MED ORDER — PHENOL 1.4 % MT LIQD
1.0000 | OROMUCOSAL | Status: DC | PRN
  Filled 2015-11-10: qty 177

## 2015-11-10 MED ORDER — TRANEXAMIC ACID 1000 MG/10ML IV SOLN
1000.0000 mg | INTRAVENOUS | Status: AC
  Administered 2015-11-10: 1000 mg via INTRAVENOUS
  Filled 2015-11-10: qty 10

## 2015-11-10 MED ORDER — ACETAMINOPHEN 10 MG/ML IV SOLN
1000.0000 mg | Freq: Once | INTRAVENOUS | Status: AC
  Administered 2015-11-10: 1000 mg via INTRAVENOUS

## 2015-11-10 MED ORDER — SIMVASTATIN 20 MG PO TABS
20.0000 mg | ORAL_TABLET | Freq: Every day | ORAL | Status: DC
  Administered 2015-11-10 – 2015-11-11 (×2): 20 mg via ORAL
  Filled 2015-11-10 (×2): qty 1

## 2015-11-10 MED ORDER — HYDROMORPHONE HCL 1 MG/ML IJ SOLN
INTRAMUSCULAR | Status: AC
  Filled 2015-11-10: qty 1

## 2015-11-10 MED ORDER — AMLODIPINE BESYLATE 5 MG PO TABS
5.0000 mg | ORAL_TABLET | Freq: Every day | ORAL | Status: DC
  Administered 2015-11-11 – 2015-11-12 (×2): 5 mg via ORAL
  Filled 2015-11-10 (×3): qty 1

## 2015-11-10 MED ORDER — SODIUM CHLORIDE 0.9 % IJ SOLN
INTRAMUSCULAR | Status: AC
  Filled 2015-11-10: qty 50

## 2015-11-10 MED ORDER — EPHEDRINE SULFATE 50 MG/ML IJ SOLN
INTRAMUSCULAR | Status: DC | PRN
  Administered 2015-11-10 (×2): 10 mg via INTRAVENOUS
  Administered 2015-11-10: 5 mg via INTRAVENOUS

## 2015-11-10 MED ORDER — DEXAMETHASONE SODIUM PHOSPHATE 10 MG/ML IJ SOLN
10.0000 mg | Freq: Once | INTRAMUSCULAR | Status: AC
  Administered 2015-11-11: 10 mg via INTRAVENOUS
  Filled 2015-11-10: qty 1

## 2015-11-10 MED ORDER — ACETAMINOPHEN 500 MG PO TABS
1000.0000 mg | ORAL_TABLET | Freq: Four times a day (QID) | ORAL | Status: AC
  Administered 2015-11-10 – 2015-11-11 (×4): 1000 mg via ORAL
  Filled 2015-11-10 (×4): qty 2

## 2015-11-10 MED ORDER — METOCLOPRAMIDE HCL 5 MG PO TABS: 5.0000 mg | ORAL_TABLET | Freq: Three times a day (TID) | ORAL | Status: DC | PRN

## 2015-11-10 MED ORDER — SUGAMMADEX SODIUM 200 MG/2ML IV SOLN
INTRAVENOUS | Status: DC | PRN
  Administered 2015-11-10: 180 mg via INTRAVENOUS

## 2015-11-10 MED ORDER — BISACODYL 10 MG RE SUPP: 10.0000 mg | Freq: Every day | RECTAL | Status: DC | PRN

## 2015-11-10 MED ORDER — DEXAMETHASONE SODIUM PHOSPHATE 10 MG/ML IJ SOLN
INTRAMUSCULAR | Status: AC
  Filled 2015-11-10: qty 1

## 2015-11-10 MED ORDER — ONDANSETRON HCL 4 MG/2ML IJ SOLN
INTRAMUSCULAR | Status: AC
  Filled 2015-11-10: qty 2

## 2015-11-10 MED ORDER — BUPIVACAINE HCL (PF) 0.25 % IJ SOLN
INTRAMUSCULAR | Status: AC
  Filled 2015-11-10: qty 30

## 2015-11-10 MED ORDER — LACTATED RINGERS IV SOLN: INTRAVENOUS | Status: DC

## 2015-11-10 MED ORDER — TRAMADOL HCL 50 MG PO TABS: 50.0000 mg | ORAL_TABLET | Freq: Four times a day (QID) | ORAL | Status: DC | PRN

## 2015-11-10 MED ORDER — DIPHENHYDRAMINE HCL 12.5 MG/5ML PO ELIX: 12.5000 mg | ORAL_SOLUTION | ORAL | Status: DC | PRN

## 2015-11-10 MED ORDER — BUPIVACAINE LIPOSOME 1.3 % IJ SUSP
20.0000 mL | Freq: Once | INTRAMUSCULAR | Status: DC
  Filled 2015-11-10: qty 20

## 2015-11-10 MED ORDER — EPHEDRINE SULFATE 50 MG/ML IJ SOLN
INTRAMUSCULAR | Status: AC
  Filled 2015-11-10: qty 1

## 2015-11-10 MED ORDER — BUPIVACAINE LIPOSOME 1.3 % IJ SUSP
INTRAMUSCULAR | Status: DC | PRN
  Administered 2015-11-10: 20 mL

## 2015-11-10 MED ORDER — ONDANSETRON HCL 4 MG/2ML IJ SOLN: 4.0000 mg | Freq: Four times a day (QID) | INTRAMUSCULAR | Status: DC | PRN

## 2015-11-10 MED ORDER — LIDOCAINE HCL (CARDIAC) 20 MG/ML IV SOLN
INTRAVENOUS | Status: AC
  Filled 2015-11-10: qty 10

## 2015-11-10 MED ORDER — PROPOFOL 10 MG/ML IV BOLUS
INTRAVENOUS | Status: DC | PRN
  Administered 2015-11-10: 200 mg via INTRAVENOUS
  Administered 2015-11-10: 40 mg via INTRAVENOUS

## 2015-11-10 MED ORDER — TRANEXAMIC ACID 1000 MG/10ML IV SOLN
1000.0000 mg | Freq: Once | INTRAVENOUS | Status: AC
  Administered 2015-11-10: 1000 mg via INTRAVENOUS
  Filled 2015-11-10: qty 10

## 2015-11-10 MED ORDER — MIDAZOLAM HCL 5 MG/5ML IJ SOLN
INTRAMUSCULAR | Status: DC | PRN
  Administered 2015-11-10: 2 mg via INTRAVENOUS

## 2015-11-10 MED ORDER — ONDANSETRON HCL 4 MG/2ML IJ SOLN
INTRAMUSCULAR | Status: DC | PRN
  Administered 2015-11-10: 4 mg via INTRAVENOUS

## 2015-11-10 MED ORDER — SODIUM CHLORIDE 0.9 % IJ SOLN
INTRAMUSCULAR | Status: AC
  Filled 2015-11-10: qty 10

## 2015-11-10 MED ORDER — METOCLOPRAMIDE HCL 5 MG/ML IJ SOLN: 5.0000 mg | Freq: Three times a day (TID) | INTRAMUSCULAR | Status: DC | PRN

## 2015-11-10 MED ORDER — HYDROMORPHONE HCL 1 MG/ML IJ SOLN
INTRAMUSCULAR | Status: DC | PRN
  Administered 2015-11-10 (×2): 1 mg via INTRAVENOUS

## 2015-11-10 MED ORDER — DOCUSATE SODIUM 100 MG PO CAPS
100.0000 mg | ORAL_CAPSULE | Freq: Two times a day (BID) | ORAL | Status: DC
  Administered 2015-11-10 – 2015-11-12 (×4): 100 mg via ORAL
  Filled 2015-11-10 (×4): qty 1

## 2015-11-10 MED ORDER — SODIUM CHLORIDE 0.9 % IV SOLN
INTRAVENOUS | Status: DC
  Administered 2015-11-10 – 2015-11-11 (×2): via INTRAVENOUS

## 2015-11-10 MED ORDER — METHOCARBAMOL 500 MG PO TABS
500.0000 mg | ORAL_TABLET | Freq: Four times a day (QID) | ORAL | Status: DC | PRN
  Administered 2015-11-10 – 2015-11-12 (×5): 500 mg via ORAL
  Filled 2015-11-10 (×5): qty 1

## 2015-11-10 MED ORDER — SODIUM CHLORIDE 0.9 % IJ SOLN
INTRAMUSCULAR | Status: DC | PRN
  Administered 2015-11-10: 30 mL

## 2015-11-10 MED ORDER — HYDROMORPHONE HCL 1 MG/ML IJ SOLN
0.5000 mg | INTRAMUSCULAR | Status: DC | PRN
  Administered 2015-11-10 – 2015-11-11 (×3): 0.5 mg via INTRAVENOUS
  Filled 2015-11-10 (×3): qty 1

## 2015-11-10 MED ORDER — HYDROMORPHONE HCL 2 MG/ML IJ SOLN
INTRAMUSCULAR | Status: AC
  Filled 2015-11-10: qty 1

## 2015-11-10 MED ORDER — LIDOCAINE HCL (CARDIAC) 20 MG/ML IV SOLN
INTRAVENOUS | Status: DC | PRN
  Administered 2015-11-10: 50 mg via INTRAVENOUS

## 2015-11-10 MED ORDER — SUGAMMADEX SODIUM 200 MG/2ML IV SOLN
INTRAVENOUS | Status: AC
  Filled 2015-11-10: qty 2

## 2015-11-10 MED ORDER — METHOCARBAMOL 1000 MG/10ML IJ SOLN
500.0000 mg | Freq: Four times a day (QID) | INTRAVENOUS | Status: DC | PRN
  Filled 2015-11-10: qty 5

## 2015-11-10 SURGICAL SUPPLY — 54 items
BAG DECANTER FOR FLEXI CONT (MISCELLANEOUS) ×1 IMPLANT
BAG SPEC THK2 15X12 ZIP CLS (MISCELLANEOUS) ×1
BAG ZIPLOCK 12X15 (MISCELLANEOUS) ×3 IMPLANT
BANDAGE ACE 6X5 VEL STRL LF (GAUZE/BANDAGES/DRESSINGS) ×3 IMPLANT
BLADE SAG 18X100X1.27 (BLADE) ×3 IMPLANT
BLADE SAW SGTL 11.0X1.19X90.0M (BLADE) ×3 IMPLANT
BOWL SMART MIX CTS (DISPOSABLE) ×3 IMPLANT
CAPT KNEE TOTAL 3 ATTUNE ×2 IMPLANT
CEMENT HV SMART SET (Cement) ×6 IMPLANT
CLOSURE WOUND 1/2 X4 (GAUZE/BANDAGES/DRESSINGS)
CLOTH BEACON ORANGE TIMEOUT ST (SAFETY) ×3 IMPLANT
CUFF TOURN SGL QUICK 34 (TOURNIQUET CUFF) ×3
CUFF TRNQT CYL 34X4X40X1 (TOURNIQUET CUFF) ×1 IMPLANT
DECANTER SPIKE VIAL GLASS SM (MISCELLANEOUS) ×3 IMPLANT
DRAPE U-SHAPE 47X51 STRL (DRAPES) ×3 IMPLANT
DRSG ADAPTIC 3X8 NADH LF (GAUZE/BANDAGES/DRESSINGS) ×3 IMPLANT
DRSG PAD ABDOMINAL 8X10 ST (GAUZE/BANDAGES/DRESSINGS) ×3 IMPLANT
DURAPREP 26ML APPLICATOR (WOUND CARE) ×3 IMPLANT
ELECT REM PT RETURN 9FT ADLT (ELECTROSURGICAL) ×3
ELECTRODE REM PT RTRN 9FT ADLT (ELECTROSURGICAL) ×1 IMPLANT
EVACUATOR 1/8 PVC DRAIN (DRAIN) ×3 IMPLANT
GAUZE SPONGE 4X4 12PLY STRL (GAUZE/BANDAGES/DRESSINGS) ×3 IMPLANT
GLOVE BIO SURGEON STRL SZ7.5 (GLOVE) IMPLANT
GLOVE BIO SURGEON STRL SZ8 (GLOVE) ×1 IMPLANT
GLOVE BIOGEL PI IND STRL 6.5 (GLOVE) IMPLANT
GLOVE BIOGEL PI IND STRL 8 (GLOVE) ×1 IMPLANT
GLOVE BIOGEL PI INDICATOR 6.5 (GLOVE)
GLOVE BIOGEL PI INDICATOR 8 (GLOVE) ×4
GLOVE SURG SS PI 6.5 STRL IVOR (GLOVE) IMPLANT
GLOVE SURG SS PI 7.5 STRL IVOR (GLOVE) ×2 IMPLANT
GLOVE SURG SS PI 8.0 STRL IVOR (GLOVE) ×2 IMPLANT
GOWN STRL REUS W/TWL LRG LVL3 (GOWN DISPOSABLE) ×3 IMPLANT
GOWN STRL REUS W/TWL XL LVL3 (GOWN DISPOSABLE) ×2 IMPLANT
HANDPIECE INTERPULSE COAX TIP (DISPOSABLE) ×3
IMMOBILIZER KNEE 20 (SOFTGOODS) ×3
IMMOBILIZER KNEE 20 THIGH 36 (SOFTGOODS) ×1 IMPLANT
LIQUID BAND (GAUZE/BANDAGES/DRESSINGS) ×2 IMPLANT
MANIFOLD NEPTUNE II (INSTRUMENTS) ×3 IMPLANT
NS IRRIG 1000ML POUR BTL (IV SOLUTION) ×3 IMPLANT
PACK TOTAL KNEE CUSTOM (KITS) ×3 IMPLANT
PADDING CAST COTTON 6X4 STRL (CAST SUPPLIES) ×7 IMPLANT
POSITIONER SURGICAL ARM (MISCELLANEOUS) ×3 IMPLANT
SET HNDPC FAN SPRY TIP SCT (DISPOSABLE) ×1 IMPLANT
STRIP CLOSURE SKIN 1/2X4 (GAUZE/BANDAGES/DRESSINGS) ×2 IMPLANT
SUT MNCRL AB 4-0 PS2 18 (SUTURE) ×3 IMPLANT
SUT VIC AB 2-0 CT1 27 (SUTURE) ×9
SUT VIC AB 2-0 CT1 TAPERPNT 27 (SUTURE) ×3 IMPLANT
SUT VLOC 180 0 24IN GS25 (SUTURE) ×3 IMPLANT
SYR 50ML LL SCALE MARK (SYRINGE) ×1 IMPLANT
TRAY FOLEY W/METER SILVER 14FR (SET/KITS/TRAYS/PACK) ×1 IMPLANT
TRAY FOLEY W/METER SILVER 16FR (SET/KITS/TRAYS/PACK) ×3 IMPLANT
WATER STERILE IRR 1500ML POUR (IV SOLUTION) ×3 IMPLANT
WRAP KNEE MAXI GEL POST OP (GAUZE/BANDAGES/DRESSINGS) ×3 IMPLANT
YANKAUER SUCT BULB TIP 10FT TU (MISCELLANEOUS) ×3 IMPLANT

## 2015-11-10 NOTE — Anesthesia Postprocedure Evaluation (Signed)
Anesthesia Post Note  Patient: Shaun Carey  Procedure(s) Performed: Procedure(s) (LRB): TOTAL RIGHT KNEE ARTHROPLASTY (Right)  Patient location during evaluation: PACU Anesthesia Type: General Level of consciousness: awake and alert Pain management: pain level controlled Vital Signs Assessment: post-procedure vital signs reviewed and stable Respiratory status: spontaneous breathing, nonlabored ventilation, respiratory function stable and patient connected to nasal cannula oxygen Cardiovascular status: blood pressure returned to baseline and stable Postop Assessment: no signs of nausea or vomiting Anesthetic complications: no    Last Vitals:  Filed Vitals:   11/10/15 1500 11/10/15 1519  BP: 120/80 114/65  Pulse: 75 70  Temp: 36.4 C 36.5 C  Resp: 20 20    Last Pain:  Filed Vitals:   11/10/15 1523  PainSc: 3                  Calisa Luckenbaugh,JAMES TERRILL

## 2015-11-10 NOTE — Transfer of Care (Signed)
Immediate Anesthesia Transfer of Care Note  Patient: Shaun Carey  Procedure(s) Performed: Procedure(s): TOTAL RIGHT KNEE ARTHROPLASTY (Right)  Patient Location: PACU  Anesthesia Type:General  Level of Consciousness: awake, alert , oriented and patient cooperative  Airway & Oxygen Therapy: Patient Spontanous Breathing and Patient connected to face mask oxygen  Post-op Assessment: Report given to RN, Post -op Vital signs reviewed and stable and Patient moving all extremities  Post vital signs: Reviewed and stable  Last Vitals:  Filed Vitals:   11/10/15 0934  BP: 146/81  Pulse: 92  Temp: 36.6 C  Resp: 18    Last Pain: There were no vitals filed for this visit.    Patients Stated Pain Goal: 4 ([id number])  Complications: No apparent anesthesia complications

## 2015-11-10 NOTE — Progress Notes (Signed)
Utilization review completed.  

## 2015-11-10 NOTE — Interval H&P Note (Signed)
History and Physical Interval Note:  11/10/2015 11:22 AM  Shaun Carey  has presented today for surgery, with the diagnosis of OA RIGHT KNEE   The various methods of treatment have been discussed with the patient and family. After consideration of risks, benefits and other options for treatment, the patient has consented to  Procedure(s): TOTAL RIGHT KNEE ARTHROPLASTY (Right) as a surgical intervention .  The patient's history has been reviewed, patient examined, no change in status, stable for surgery.  I have reviewed the patient's chart and labs.  Questions were answered to the patient's satisfaction.     [name]

## 2015-11-10 NOTE — Anesthesia Preprocedure Evaluation (Addendum)
Anesthesia Evaluation  Patient identified by MRN, date of birth, ID band Patient awake    Reviewed: Allergy & Precautions, NPO status , Patient's Chart, lab work & pertinent test results  Airway Mallampati: II  TM Distance: >3 FB Neck ROM: Full    Dental  (+) Teeth Intact   Pulmonary neg pulmonary ROS,    breath sounds clear to auscultation       Cardiovascular hypertension, + Valvular Problems/Murmurs MR  Rhythm:Regular Rate:Normal     Neuro/Psych    GI/Hepatic negative GI ROS, Neg liver ROS,   Endo/Other  negative endocrine ROS  Renal/GU negative Renal ROS     Musculoskeletal  (+) Arthritis ,   Abdominal   Peds  Hematology   Anesthesia Other Findings   Reproductive/Obstetrics                            Anesthesia Physical Anesthesia Plan  ASA: II  Anesthesia Plan: General   Post-op Pain Management:    Induction: Intravenous  Airway Management Planned: Oral ETT  Additional Equipment:   Intra-op Plan:   Post-operative Plan: Extubation in OR  Informed Consent: I have reviewed the patients History and Physical, chart, labs and discussed the procedure including the risks, benefits and alternatives for the proposed anesthesia with the patient or authorized representative who has indicated his/her understanding and acceptance.   Dental advisory given  Plan Discussed with: CRNA and Surgeon  Anesthesia Plan Comments:        Anesthesia Quick Evaluation

## 2015-11-10 NOTE — Anesthesia Procedure Notes (Signed)
Procedure Name: Intubation Date/Time: 11/10/2015 12:05 PM Performed by: Kurstin Dimarzo, Virgel Gess Pre-anesthesia Checklist: Patient identified, Emergency Drugs available, Suction available, Patient being monitored and Timeout performed Patient Re-evaluated:Patient Re-evaluated prior to inductionOxygen Delivery Method: Circle system utilized Preoxygenation: Pre-oxygenation with 100% oxygen Intubation Type: IV induction Ventilation: Mask ventilation without difficulty Laryngoscope Size: Mac and 4 Grade View: Grade II Tube type: Oral Tube size: 7.5 mm Number of attempts: 1 Airway Equipment and Method: Stylet Placement Confirmation: ETT inserted through vocal cords under direct vision,  positive ETCO2,  CO2 detector and breath sounds checked- equal and bilateral Secured at: 23 cm Tube secured with: Tape Dental Injury: Teeth and Oropharynx as per pre-operative assessment

## 2015-11-10 NOTE — Op Note (Signed)
Pre-operative diagnosis- Osteoarthritis  Right knee(s)  Post-operative diagnosis- Osteoarthritis Right knee(s)  Procedure-  Right  Total Knee Arthroplasty  Surgeon- Dione Plover. Kaci Freel, MD  Assistant- Arlee Muslim, PA-C   Anesthesia-  General  EBL-* No blood loss amount entered *   Drains Hemovac  Tourniquet time-  Total Tourniquet Time Documented: Thigh (Right) - 33 minutes Total: Thigh (Right) - 33 minutes     Complications- None  Condition-PACU - hemodynamically stable.   Brief Clinical Note  Shaun Carey is a 75 y.o. year old male with end stage OA of his right knee with progressively worsening pain and dysfunction. He has constant pain, with activity and at rest and significant functional deficits with difficulties even with ADLs. He has had extensive non-op management including analgesics, injections of cortisone, and home exercise program, but remains in significant pain with significant dysfunction. Radiographs show bone on bone arthritis medial and patellofemoral. He presents now for right Total Knee Arthroplasty.    Procedure in detail---   The patient is brought into the operating room and positioned supine on the operating table. After successful administration of  General,   a tourniquet is placed high on the  Right thigh(s) and the lower extremity is prepped and draped in the usual sterile fashion. Time out is performed by the operating team and then the  Right lower extremity is wrapped in Esmarch, knee flexed and the tourniquet inflated to 300 mmHg.       A midline incision is made with a ten blade through the subcutaneous tissue to the level of the extensor mechanism. A fresh blade is used to make a medial parapatellar arthrotomy. Soft tissue over the proximal medial tibia is subperiosteally elevated to the joint line with a knife and into the semimembranosus bursa with a Cobb elevator. Soft tissue over the proximal lateral tibia is elevated with attention being paid  to avoiding the patellar tendon on the tibial tubercle. The patella is everted, knee flexed 90 degrees and the ACL and PCL are removed. Findings are bone on bone medial and patellofemoral with massive global osteophytes.        The drill is used to create a starting hole in the distal femur and the canal is thoroughly irrigated with sterile saline to remove the fatty contents. The 5 degree Right  valgus alignment guide is placed into the femoral canal and the distal femoral cutting block is pinned to remove 10 mm off the distal femur. Resection is made with an oscillating saw.      The tibia is subluxed forward and the menisci are removed. The extramedullary alignment guide is placed referencing proximally at the medial aspect of the tibial tubercle and distally along the second metatarsal axis and tibial crest. The block is pinned to remove 27mm off the more deficient medial  side. Resection is made with an oscillating saw. Size 8is the most appropriate size for the tibia and the proximal tibia is prepared with the modular drill and keel punch for that size.      The femoral sizing guide is placed and size 8 is most appropriate. Rotation is marked off the epicondylar axis and confirmed by creating a rectangular flexion gap at 90 degrees. The size 8 cutting block is pinned in this rotation and the anterior, posterior and chamfer cuts are made with the oscillating saw. The intercondylar block is then placed and that cut is made.      Trial size 8 tibial component, trial size  8 posterior stabilized femur and a 8  mm posterior stabilized rotating platform insert trial is placed. Full extension is achieved with excellent varus/valgus and anterior/posterior balance throughout full range of motion. The patella is everted and thickness measured to be 27  mm. Free hand resection is taken to 15 mm, a 41 template is placed, lug holes are drilled, trial patella is placed, and it tracks normally. Osteophytes are removed off  the posterior femur with the trial in place. All trials are removed and the cut bone surfaces prepared with pulsatile lavage. Cement is mixed and once ready for implantation, the size 8 tibial implant, size  8 posterior stabilized femoral component, and the size 41 patella are cemented in place and the patella is held with the clamp. The trial insert is placed and the knee held in full extension. The Exparel (20 ml mixed with 30 ml saline) and .25% Bupivicaine, are injected into the extensor mechanism, posterior capsule, medial and lateral gutters and subcutaneous tissues.  All extruded cement is removed and once the cement is hard the permanent 8 mm posterior stabilized rotating platform insert is placed into the tibial tray.      The wound is copiously irrigated with saline solution and the extensor mechanism closed over a hemovac drain with #1 V-loc suture. The tourniquet is released for a total tourniquet time of 33  minutes. Flexion against gravity is 140 degrees and the patella tracks normally. Subcutaneous tissue is closed with 2.0 vicryl and subcuticular with running 4.0 Monocryl. The incision is cleaned and dried and Dermabond and a bulky sterile dressing are applied. The limb is placed into a knee immobilizer and the patient is awakened and transported to recovery in stable condition.      Please note that a surgical assistant was a medical necessity for this procedure in order to perform it in a safe and expeditious manner. Surgical assistant was necessary to retract the ligaments and vital neurovascular structures to prevent injury to them and also necessary for proper positioning of the limb to allow for anatomic placement of the prosthesis.   Dione Plover Meya Clutter, MD    11/10/2015, 1:08 PM

## 2015-11-11 LAB — CBC
HEMATOCRIT: 31.4 % — AB (ref 39.0–52.0)
Hemoglobin: 10.3 g/dL — ABNORMAL LOW (ref 13.0–17.0)
MCH: 28.2 pg (ref 26.0–34.0)
MCHC: 32.8 g/dL (ref 30.0–36.0)
MCV: 86 fL (ref 78.0–100.0)
Platelets: 166 10*3/uL (ref 150–400)
RBC: 3.65 MIL/uL — ABNORMAL LOW (ref 4.22–5.81)
RDW: 15.5 % (ref 11.5–15.5)
WBC: 10.8 10*3/uL — ABNORMAL HIGH (ref 4.0–10.5)

## 2015-11-11 LAB — BASIC METABOLIC PANEL
Anion gap: 5 (ref 5–15)
BUN: 11 mg/dL (ref 6–20)
CALCIUM: 8.6 mg/dL — AB (ref 8.9–10.3)
CHLORIDE: 105 mmol/L (ref 101–111)
CO2: 27 mmol/L (ref 22–32)
CREATININE: 0.65 mg/dL (ref 0.61–1.24)
GFR calc Af Amer: >60 mL/min (ref >60)
GFR calc non Af Amer: >60 mL/min (ref >60)
GLUCOSE: 179 mg/dL — AB (ref 65–99)
Potassium: 4.3 mmol/L (ref 3.5–5.1)
Sodium: 137 mmol/L (ref 135–145)

## 2015-11-11 LAB — TYPE AND SCREEN
ABO/RH(D): O NEG
Antibody Screen: NEGATIVE

## 2015-11-11 MED ORDER — METHOCARBAMOL 500 MG PO TABS: 500.0000 mg | ORAL_TABLET | Freq: Four times a day (QID) | ORAL | Status: AC | PRN

## 2015-11-11 MED ORDER — HYDROMORPHONE HCL 2 MG PO TABS: 2.0000 mg | ORAL_TABLET | ORAL | Status: AC | PRN

## 2015-11-11 MED ORDER — TRAMADOL HCL 50 MG PO TABS: 50.0000 mg | ORAL_TABLET | Freq: Four times a day (QID) | ORAL | Status: AC | PRN

## 2015-11-11 MED ORDER — ASPIRIN 325 MG PO TBEC: 325.0000 mg | DELAYED_RELEASE_TABLET | Freq: Two times a day (BID) | ORAL | Status: AC

## 2015-11-11 NOTE — Discharge Summary (Signed)
Physician Discharge Summary   Patient ID: Shaun Carey MRN: [id number] DOB/AGE: 1941/05/27 75 y.o.  Admit date: 11/10/2015 Discharge date: 11/12/2015  Primary Diagnosis:  Osteoarthritis Right knee(s)  Admission Diagnoses:  Past Medical History  Diagnosis Date   Hypertension    Hyperlipidemia    Anxiety    DJD (degenerative joint disease) of knee    Gout    HSV-1 (herpes simplex virus 1) infection    History of right shoulder fracture    History of weight change     lost 30 lbs over last 5 years    Fatigue    Balance problems    Back pain     severe   MRSA (methicillin resistant staph aureus) culture positive    Insomnia    Numbness and tingling     feet bilat    Wears glasses    History of blood transfusion    Elevated cholesterol    Discharge Diagnoses:   Active Problems:   OA (osteoarthritis) of knee  Estimated body mass index is 24.99 kg/(m^2) as calculated from the following:   Height as of this encounter: '5\' 11"'  (1.803 m).   Weight as of this encounter: 81.251 kg (179 lb 2 oz).  Procedure:  Procedure(s) (LRB): TOTAL RIGHT KNEE ARTHROPLASTY (Right)   Consults: None  HPI: Shaun Carey is a 75 y.o. year old male with end stage OA of his right knee with progressively worsening pain and dysfunction. He has constant pain, with activity and at rest and significant functional deficits with difficulties even with ADLs. He has had extensive non-op management including analgesics, injections of cortisone, and home exercise program, but remains in significant pain with significant dysfunction. Radiographs show bone on bone arthritis medial and patellofemoral. He presents now for right Total Knee Arthroplasty.   Laboratory Data: Admission on 11/10/2015  Component Date Value Ref Range Status   WBC 11/11/2015 10.8* 4.0 - 10.5 K/uL Final   RBC 11/11/2015 3.65* 4.22 - 5.81 MIL/uL Final   Hemoglobin 11/11/2015 10.3* 13.0 - 17.0 g/dL Final    HCT 11/11/2015 31.4* 39.0 - 52.0 % Final   MCV 11/11/2015 86.0  78.0 - 100.0 fL Final   MCH 11/11/2015 28.2  26.0 - 34.0 pg Final   MCHC 11/11/2015 32.8  30.0 - 36.0 g/dL Final   RDW 11/11/2015 15.5  11.5 - 15.5 % Final   Platelets 11/11/2015 166  150 - 400 K/uL Final   Sodium 11/11/2015 137  135 - 145 mmol/L Final   Potassium 11/11/2015 4.3  3.5 - 5.1 mmol/L Final   Chloride 11/11/2015 105  101 - 111 mmol/L Final   CO2 11/11/2015 27  22 - 32 mmol/L Final   Glucose, Bld 11/11/2015 179* 65 - 99 mg/dL Final   BUN 11/11/2015 11  6 - 20 mg/dL Final   Creatinine, Ser 11/11/2015 0.65  0.61 - 1.24 mg/dL Final   Calcium 11/11/2015 8.6* 8.9 - 10.3 mg/dL Final   GFR calc non Af Amer 11/11/2015 >60  >60 mL/min Final   GFR calc Af Amer 11/11/2015 >60  >60 mL/min Final   Comment: (NOTE) The eGFR has been calculated using the CKD EPI equation. This calculation has not been validated in all clinical situations. eGFR's persistently <60 mL/min signify possible Chronic Kidney Disease.    Anion gap 11/11/2015 5  5 - 15 Final   WBC 11/12/2015 13.5* 4.0 - 10.5 K/uL Final   RBC 11/12/2015 3.14* 4.22 - 5.81 MIL/uL Final  Hemoglobin 11/12/2015 9.0* 13.0 - 17.0 g/dL Final   HCT 11/12/2015 27.1* 39.0 - 52.0 % Final   MCV 11/12/2015 86.3  78.0 - 100.0 fL Final   MCH 11/12/2015 28.7  26.0 - 34.0 pg Final   MCHC 11/12/2015 33.2  30.0 - 36.0 g/dL Final   RDW 11/12/2015 15.9* 11.5 - 15.5 % Final   Platelets 11/12/2015 160  150 - 400 K/uL Final   Sodium 11/12/2015 138  135 - 145 mmol/L Final   Potassium 11/12/2015 4.1  3.5 - 5.1 mmol/L Final   Chloride 11/12/2015 105  101 - 111 mmol/L Final   CO2 11/12/2015 29  22 - 32 mmol/L Final   Glucose, Bld 11/12/2015 119* 65 - 99 mg/dL Final   BUN 11/12/2015 11  6 - 20 mg/dL Final   Creatinine, Ser 11/12/2015 0.74  0.61 - 1.24 mg/dL Final   Calcium 11/12/2015 8.8* 8.9 - 10.3 mg/dL Final   GFR calc non Af Amer 11/12/2015 >60  >60  mL/min Final   GFR calc Af Amer 11/12/2015 >60  >60 mL/min Final   Comment: (NOTE) The eGFR has been calculated using the CKD EPI equation. This calculation has not been validated in all clinical situations. eGFR's persistently <60 mL/min signify possible Chronic Kidney Disease.    Anion gap 11/12/2015 4* 5 - 15 Final  Hospital Outpatient Visit on 10/31/2015  Component Date Value Ref Range Status   aPTT 10/31/2015 30  24 - 37 seconds Final   WBC 10/31/2015 10.0  4.0 - 10.5 K/uL Final   RBC 10/31/2015 4.93  4.22 - 5.81 MIL/uL Final   Hemoglobin 10/31/2015 13.8  13.0 - 17.0 g/dL Final   HCT 10/31/2015 41.5  39.0 - 52.0 % Final   MCV 10/31/2015 84.2  78.0 - 100.0 fL Final   MCH 10/31/2015 28.0  26.0 - 34.0 pg Final   MCHC 10/31/2015 33.3  30.0 - 36.0 g/dL Final   RDW 10/31/2015 15.8* 11.5 - 15.5 % Final   Platelets 10/31/2015 182  150 - 400 K/uL Final   Sodium 10/31/2015 141  135 - 145 mmol/L Final   Potassium 10/31/2015 5.1  3.5 - 5.1 mmol/L Final   Chloride 10/31/2015 104  101 - 111 mmol/L Final   CO2 10/31/2015 27  22 - 32 mmol/L Final   Glucose, Bld 10/31/2015 115* 65 - 99 mg/dL Final   BUN 10/31/2015 12  6 - 20 mg/dL Final   Creatinine, Ser 10/31/2015 0.70  0.61 - 1.24 mg/dL Final   Calcium 10/31/2015 10.0  8.9 - 10.3 mg/dL Final   Total Protein 10/31/2015 7.7  6.5 - 8.1 g/dL Final   Albumin 10/31/2015 4.5  3.5 - 5.0 g/dL Final   AST 10/31/2015 25  15 - 41 U/L Final   ALT 10/31/2015 15* 17 - 63 U/L Final   Alkaline Phosphatase 10/31/2015 131* 38 - 126 U/L Final   Total Bilirubin 10/31/2015 0.6  0.3 - 1.2 mg/dL Final   GFR calc non Af Amer 10/31/2015 >60  >60 mL/min Final   GFR calc Af Amer 10/31/2015 >60  >60 mL/min Final   Comment: (NOTE) The eGFR has been calculated using the CKD EPI equation. This calculation has not been validated in all clinical situations. eGFR's persistently <60 mL/min signify possible Chronic Kidney Disease.    Anion  gap 10/31/2015 10  5 - 15 Final   Prothrombin Time 10/31/2015 13.6  11.6 - 15.2 seconds Final   INR 10/31/2015 1.02  0.00 - 1.49  Final   ABO/RH(D) 10/31/2015 O NEG   Final   Antibody Screen 10/31/2015 NEG   Final   Sample Expiration 10/31/2015 11/13/2015   Final   Extend sample reason 10/31/2015 NO TRANSFUSIONS OR PREGNANCY IN THE PAST 3 MONTHS   Final   Color, Urine 10/31/2015 YELLOW  YELLOW Final   APPearance 10/31/2015 CLEAR  CLEAR Final   Specific Gravity, Urine 10/31/2015 1.015  1.005 - 1.030 Final   pH 10/31/2015 6.0  5.0 - 8.0 Final   Glucose, UA 10/31/2015 NEGATIVE  NEGATIVE mg/dL Final   Hgb urine dipstick 10/31/2015 NEGATIVE  NEGATIVE Final   Bilirubin Urine 10/31/2015 NEGATIVE  NEGATIVE Final   Ketones, ur 10/31/2015 NEGATIVE  NEGATIVE mg/dL Final   Protein, ur 10/31/2015 NEGATIVE  NEGATIVE mg/dL Final   Nitrite 10/31/2015 NEGATIVE  NEGATIVE Final   Leukocytes, UA 10/31/2015 NEGATIVE  NEGATIVE Final   MICROSCOPIC NOT DONE ON URINES WITH NEGATIVE PROTEIN, BLOOD, LEUKOCYTES, NITRITE, OR GLUCOSE <1000 mg/dL.   MRSA, PCR 10/31/2015 INVALID RESULTS, SPECIMEN SENT FOR CULTURE* NEGATIVE Final   Staphylococcus aureus 10/31/2015 INVALID RESULTS, SPECIMEN SENT FOR CULTURE* NEGATIVE Final   Comment:        The Xpert SA Assay (FDA approved for NASAL specimens in patients over 28 years of age), is one component of a comprehensive surveillance program.  Test performance has been validated by [facility] for patients greater than or equal to 94 year old. It is not intended to diagnose infection nor to guide or monitor treatment.    Specimen Description 10/31/2015 NOSE   Final   Special Requests 10/31/2015 NONE   Final   Culture 10/31/2015    Final                   Value:NOMRSA Performed at [facility]    Report Status 10/31/2015 11/02/2015 FINAL   Final     X-Rays:No results found.  EKG: Orders placed or performed during the hospital  encounter of 07/11/15   EKG 12-Lead   EKG 12-Lead   EKG 12-Lead   EKG 12-Lead   EKG 12-Lead   EKG 12-Lead     Hospital Course: Shaun Carey is a 75 y.o. who was admitted to [facility]. They were brought to the operating room on 11/10/2015 and underwent Procedure(s): TOTAL RIGHT KNEE ARTHROPLASTY.  Patient tolerated the procedure well and was later transferred to the recovery room and then to the orthopaedic floor for postoperative care.  They were given PO and IV analgesics for pain control following their surgery.  They were given 24 hours of postoperative antibiotics of  Anti-infectives    Start     Dose/Rate Route Frequency Ordered Stop   11/10/15 2000  acyclovir (ZOVIRAX) tablet 400 mg     400 mg Oral 2 times daily 11/10/15 1522     11/10/15 1830  ceFAZolin (ANCEF) IVPB 2g/100 mL premix     2 g 200 mL/hr over 30 Minutes Intravenous Every 6 hours 11/10/15 1522 11/11/15 0111   11/10/15 0929  ceFAZolin (ANCEF) IVPB 2g/100 mL premix     2 g 200 mL/hr over 30 Minutes Intravenous On call to O.R. 11/10/15 [id number] 11/10/15 1210     and started on DVT prophylaxis in the form of Aspirin.   PT and OT were ordered for total joint protocol.  Discharge planning consulted to help with postop disposition and equipment needs.  Patient had a good night on the evening of surgery.  Daughter in  room at bedside on day one.  Patient was well, but has had some minor complaints of pain in the knee, requiring pain medications.  They started to get up OOB with therapy on day one and walked 115 feet by the therpay notes. Hemovac drain was pulled without difficulty.  Continued to work with therapy into day two.  Dressing was changed on day two and the incision was clean, dry, no drainage, and a small blister was noted (patient with history on many blisters following the previous knee surgery. Patient was seen in rounds and was ready to go home that same day on day two.  Discharge home with home  health Diet - Cardiac diet Follow up - in 2 weeks Activity - WBAT Disposition - Home Condition Upon Discharge - Good D/C Meds - See DC Summary DVT Prophylaxis - Aspirin      Discharge Instructions    Call MD / Call 911    Complete by:  As directed   If you experience chest pain or shortness of breath, CALL 911 and be transported to the hospital emergency room.  If you develope a fever above 101 F, pus (white drainage) or increased drainage or redness at the wound, or calf pain, call your surgeon's office.     Change dressing    Complete by:  As directed   AQUACEL Dressings   Minimize infection and blistering and improve patient satisfaction with AQUACEL Ag Surgical. AQUACEL Ag Surgical dressings combine [employer] proprietary Hydrofiber Technology and hydrocolloid technologies with the antimicrobial action of ionic silver. This powerful combination has been proven to help: Reduce surgical site infection by up to 76% Reduce skin blistering by 88% Reduce the frequency of dressing changes Reduce delayed hospital discharge and readmissions by 80% Whats more, AQUACEL Ag Surgical dressings offer you and your patients: A waterproof barrier to protect skin and allow bathing  Antimicrobial protection*4-6  Comfort and flexibility  Skin-friendly adhesion and removal [employer] Hydrofiber Technology gives AQUACEL dressings powerful capabilities.  Leave to Aquacel Dressing on the incision for five days.  After five days, may remove and leave incision uncovered if no drainage.     Constipation Prevention    Complete by:  As directed   Drink plenty of fluids.  Prune juice may be helpful.  You may use a stool softener, such as Colace (over the counter) 100 mg twice a day.  Use MiraLax (over the counter) for constipation as needed.     Diet - low sodium heart healthy    Complete by:  As directed      Discharge instructions    Complete by:  As directed   Pick up stool softner and laxative  for home use following surgery while on pain medications. Do not submerge incision under water. Please use good hand washing techniques while changing dressing each day. May shower starting three days after surgery. Please use a clean towel to pat the incision dry following showers. Continue to use ice for pain and swelling after surgery. Do not use any lotions or creams on the incision until instructed by your surgeon.  Take a 325 mg Aspirin twice a day for three weeks and then reduce to a baby 81 mg Aspirin daily for three additional weeks.  Postoperative Constipation Protocol  Constipation - defined medically as fewer than three stools per week and severe constipation as less than one stool per week.  One of the most common issues patients have following surgery is constipation.  Even  if you have a regular bowel pattern at home, your normal regimen is likely to be disrupted due to multiple reasons following surgery.  Combination of anesthesia, postoperative narcotics, change in appetite and fluid intake all can affect your bowels.  In order to avoid complications following surgery, here are some recommendations in order to help you during your recovery period.  Colace (docusate) - Pick up an over-the-counter form of Colace or another stool softener and take twice a day as long as you are requiring postoperative pain medications.  Take with a full glass of water daily.  If you experience loose stools or diarrhea, hold the colace until you stool forms back up.  If your symptoms do not get better within 1 week or if they get worse, check with your doctor.  Dulcolax (bisacodyl) - Pick up over-the-counter and take as directed by the product packaging as needed to assist with the movement of your bowels.  Take with a full glass of water.  Use this product as needed if not relieved by Colace only.   MiraLax (polyethylene glycol) - Pick up over-the-counter to have on hand.  MiraLax is a solution that  will increase the amount of water in your bowels to assist with bowel movements.  Take as directed and can mix with a glass of water, juice, soda, coffee, or tea.  Take if you go more than two days without a movement. Do not use MiraLax more than once per day. Call your doctor if you are still constipated or irregular after using this medication for 7 days in a row.  If you continue to have problems with postoperative constipation, please contact the office for further assistance and recommendations.  If you experience "the worst abdominal pain ever" or develop nausea or vomiting, please contact the office immediatly for further recommendations for treatment.     Do not put a pillow under the knee. Place it under the heel.    Complete by:  As directed      Do not sit on low chairs, stoools or toilet seats, as it may be difficult to get up from low surfaces    Complete by:  As directed      Driving restrictions    Complete by:  As directed   No driving until released by the physician.     Increase activity slowly as tolerated    Complete by:  As directed      Lifting restrictions    Complete by:  As directed   No lifting until released by the physician.     Patient may shower    Complete by:  As directed   You may shower without a dressing once there is no drainage.  Do not wash over the wound.  If drainage remains, do not shower until drainage stops.     TED hose    Complete by:  As directed   Use stockings (TED hose) for 3 weeks on both leg(s).  You may remove them at night for sleeping.     Weight bearing as tolerated    Complete by:  As directed   Laterality:  right  Extremity:  Lower            Medication List    STOP taking these medications        COENZYME Q-10 PO     iron polysaccharides 150 MG capsule  Commonly known as:  NIFEREX     rivaroxaban 10 MG Tabs tablet  Commonly known as:  XARELTO     SUPER B COMPLEX PO     vitamin C 500 MG tablet  Commonly known as:   ASCORBIC ACID     Vitamin D (Cholecalciferol) 400 units Tabs     VITAMIN E PO      TAKE these medications        acyclovir 400 MG tablet  Commonly known as:  ZOVIRAX  Take 1 tablet (400 mg total) by mouth 2 (two) times daily.     allopurinol 300 MG tablet  Commonly known as:  ZYLOPRIM  Take 1 tablet (300 mg total) by mouth daily.     ALPRAZolam 0.5 MG tablet  Commonly known as:  XANAX  Take 1 tablet (0.5 mg total) by mouth 3 (three) times daily as needed for sleep.     amLODipine 5 MG tablet  Commonly known as:  NORVASC  Take 5 mg by mouth daily.     aspirin 325 MG EC tablet  Take 1 tablet (325 mg total) by mouth 2 (two) times daily. Take a 325 mg Aspirin twice a day for three weeks and then reduce to a baby 81 mg Aspirin daily for three additional weeks.     benazepril 40 MG tablet  Commonly known as:  LOTENSIN  Take 1 tablet (40 mg total) by mouth daily.     carvedilol 10 MG 24 hr capsule  Commonly known as:  COREG CR  Take 1 capsule (10 mg total) by mouth daily.     docusate sodium 100 MG capsule  Commonly known as:  COLACE  Take 100 mg by mouth daily as needed for mild constipation.     fluticasone 50 MCG/ACT nasal spray  Commonly known as:  FLONASE  Place 2 sprays into the nose as directed.     HYDROmorphone 2 MG tablet  Commonly known as:  DILAUDID  Take 1-2 tablets (2-4 mg total) by mouth every 4 (four) hours as needed for moderate pain or severe pain.     methocarbamol 500 MG tablet  Commonly known as:  ROBAXIN  Take 1 tablet (500 mg total) by mouth every 6 (six) hours as needed for muscle spasms.     minocycline 100 MG capsule  Commonly known as:  MINOCIN,DYNACIN  Take 1 capsule (100 mg total) by mouth 2 (two) times daily.     polyethylene glycol packet  Commonly known as:  MIRALAX / GLYCOLAX  Take 17 g by mouth daily.     simvastatin 20 MG tablet  Commonly known as:  ZOCOR  Take 1 tablet (20 mg total) by mouth at bedtime.     traMADol 50 MG  tablet  Commonly known as:  ULTRAM  Take 1-2 tablets (50-100 mg total) by mouth every 6 (six) hours as needed (mild pain).       Follow-up Information    Follow up with [facility].   Why:  Junie Panning has been requested as your physical therapist   Contact information:   [address] [place] [address] [phone]       Follow up with [name], MD On 11/25/2015.   Specialty:  Orthopedic Surgery   Why:  Call office at [phone] to setup apointment on Tuesday 11/25/2015 with Dr. [name].   Contact information:   [address] [place] [address] [phone]       Signed: Arlee Muslim, PA-C Orthopaedic Surgery 11/12/2015, 7:04 AM

## 2015-11-11 NOTE — Discharge Instructions (Addendum)
Dr. Gaynelle Arabian Total Joint Specialist [facility] [address], [place], [place] [address] [phone]  TOTAL KNEE REPLACEMENT POSTOPERATIVE DIRECTIONS  Knee Rehabilitation, Guidelines Following Surgery  Results after knee surgery are often greatly improved when you follow the exercise, range of motion and muscle strengthening exercises prescribed by your doctor. Safety measures are also important to protect the knee from further injury. Any time any of these exercises cause you to have increased pain or swelling in your knee joint, decrease the amount until you are comfortable again and slowly increase them. If you have problems or questions, call your caregiver or physical therapist for advice.   HOME CARE INSTRUCTIONS  Remove items at home which could result in a fall. This includes throw rugs or furniture in walking pathways.   ICE to the affected knee every three hours for 30 minutes at a time and then as needed for pain and swelling.  Continue to use ice on the knee for pain and swelling from surgery. You may notice swelling that will progress down to the foot and ankle.  This is normal after surgery.  Elevate the leg when you are not up walking on it.    Continue to use the breathing machine which will help keep your temperature down.  It is common for your temperature to cycle up and down following surgery, especially at night when you are not up moving around and exerting yourself.  The breathing machine keeps your lungs expanded and your temperature down.  Do not place pillow under knee, focus on keeping the knee straight while resting  DIET You may resume your previous home diet once your are discharged from the hospital.  DRESSING / WOUND CARE / SHOWERING You may shower 3 days after surgery, but keep the wounds dry during showering.  You may use an occlusive plastic wrap (Press'n Seal for example), NO SOAKING/SUBMERGING IN THE BATHTUB.  If the  bandage gets wet, change with a clean dry gauze.  If the incision gets wet, pat the wound dry with a clean towel. You may start showering once you are discharged home but do not submerge the incision under water. Just pat the incision dry and apply a dry gauze dressing on daily.  Leave the Aquacel dressing in place for five days.  After five days, may remove the Aquacel Dressing and leave the incision uncovered if no drainage.  ACTIVITY Walk with your walker as instructed. Use walker as long as suggested by your caregivers. Avoid periods of inactivity such as sitting longer than an hour when not asleep. This helps prevent blood clots.  You may resume a sexual relationship in one month or when given the OK by your doctor.  You may return to work once you are cleared by your doctor.  Do not drive a car for 6 weeks or until released by you surgeon.  Do not drive while taking narcotics.  WEIGHT BEARING Weight bearing as tolerated with assist device (walker, cane, etc) as directed, use it as long as suggested by your surgeon or therapist, typically at least 4-6 weeks.  POSTOPERATIVE CONSTIPATION PROTOCOL Constipation - defined medically as fewer than three stools per week and severe constipation as less than one stool per week.  One of the most common issues patients have following surgery is constipation.  Even if you have a regular bowel pattern at home, your normal regimen is likely to be disrupted due to multiple reasons following surgery.  Combination of  anesthesia, postoperative narcotics, change in appetite and fluid intake all can affect your bowels.  In order to avoid complications following surgery, here are some recommendations in order to help you during your recovery period.  Colace (docusate) - Pick up an over-the-counter form of Colace or another stool softener and take twice a day as long as you are requiring postoperative pain medications.  Take with a full glass of water daily.  If  you experience loose stools or diarrhea, hold the colace until you stool forms back up.  If your symptoms do not get better within 1 week or if they get worse, check with your doctor.  Dulcolax (bisacodyl) - Pick up over-the-counter and take as directed by the product packaging as needed to assist with the movement of your bowels.  Take with a full glass of water.  Use this product as needed if not relieved by Colace only.   MiraLax (polyethylene glycol) - Pick up over-the-counter to have on hand.  MiraLax is a solution that will increase the amount of water in your bowels to assist with bowel movements.  Take as directed and can mix with a glass of water, juice, soda, coffee, or tea.  Take if you go more than two days without a movement. Do not use MiraLax more than once per day. Call your doctor if you are still constipated or irregular after using this medication for 7 days in a row.  If you continue to have problems with postoperative constipation, please contact the office for further assistance and recommendations.  If you experience "the worst abdominal pain ever" or develop nausea or vomiting, please contact the office immediatly for further recommendations for treatment.  ITCHING  If you experience itching with your medications, try taking only a single pain pill, or even half a pain pill at a time.  You can also use Benadryl over the counter for itching or also to help with sleep.   TED HOSE STOCKINGS Wear the elastic stockings on both legs for three weeks following surgery during the day but you may remove then at night for sleeping.  MEDICATIONS See your medication summary on the After Visit Summary that the nursing staff will review with you prior to discharge.  You may have some home medications which will be placed on hold until you complete the course of blood thinner medication.  It is important for you to complete the blood thinner medication as prescribed by your surgeon.   Continue your approved medications as instructed at time of discharge.  PRECAUTIONS If you experience chest pain or shortness of breath - call 911 immediately for transfer to the hospital emergency department.  If you develop a fever greater that 101 F, purulent drainage from wound, increased redness or drainage from wound, foul odor from the wound/dressing, or calf pain - CONTACT YOUR SURGEON.                                                   FOLLOW-UP APPOINTMENTS Make sure you keep all of your appointments after your operation with your surgeon and caregivers. You should call the office at the above phone number and make an appointment for approximately two weeks after the date of your surgery or on the date instructed by your surgeon outlined in the "After Visit Summary".   RANGE OF MOTION  AND STRENGTHENING EXERCISES  Rehabilitation of the knee is important following a knee injury or an operation. After just a few days of immobilization, the muscles of the thigh which control the knee become weakened and shrink (atrophy). Knee exercises are designed to build up the tone and strength of the thigh muscles and to improve knee motion. Often times heat used for twenty to thirty minutes before working out will loosen up your tissues and help with improving the range of motion but do not use heat for the first two weeks following surgery. These exercises can be done on a training (exercise) mat, on the floor, on a table or on a bed. Use what ever works the best and is most comfortable for you Knee exercises include:  Leg Lifts - While your knee is still immobilized in a splint or cast, you can do straight leg raises. Lift the leg to 60 degrees, hold for 3 sec, and slowly lower the leg. Repeat 10-20 times 2-3 times daily. Perform this exercise against resistance later as your knee gets better.  Quad and Hamstring Sets - Tighten up the muscle on the front of the thigh (Quad) and hold for 5-10 sec. Repeat  this 10-20 times hourly. Hamstring sets are done by pushing the foot backward against an object and holding for 5-10 sec. Repeat as with quad sets.   Leg Slides: Lying on your back, slowly slide your foot toward your buttocks, bending your knee up off the floor (only go as far as is comfortable). Then slowly slide your foot back down until your leg is flat on the floor again.  Angel Wings: Lying on your back spread your legs to the side as far apart as you can without causing discomfort.  A rehabilitation program following serious knee injuries can speed recovery and prevent re-injury in the future due to weakened muscles. Contact your doctor or a physical therapist for more information on knee rehabilitation.   IF YOU ARE TRANSFERRED TO A SKILLED REHAB FACILITY If the patient is transferred to a skilled rehab facility following release from the hospital, a list of the current medications will be sent to the facility for the patient to continue.  When discharged from the skilled rehab facility, please have the facility set up the patient's [place] prior to being released. Also, the skilled facility will be responsible for providing the patient with their medications at time of release from the facility to include their pain medication, the muscle relaxants, and their blood thinner medication. If the patient is still at the rehab facility at time of the two week follow up appointment, the skilled rehab facility will also need to assist the patient in arranging follow up appointment in our office and any transportation needs.  MAKE SURE YOU:  Understand these instructions.  Get help right away if you are not doing well or get worse.    Pick up stool softner and laxative for home use following surgery while on pain medications. Do not submerge incision under water. Please use good hand washing techniques while changing dressing each day. May shower starting three days after  surgery. Please use a clean towel to pat the incision dry following showers. Continue to use ice for pain and swelling after surgery. Do not use any lotions or creams on the incision until instructed by your surgeon.  Take a 325 mg Aspirin twice a day for three weeks and then reduce to a baby 81 mg Aspirin daily  for three additional weeks.

## 2015-11-11 NOTE — Progress Notes (Signed)
   Subjective: 1 Day Post-Op Procedure(s) (LRB): TOTAL RIGHT KNEE ARTHROPLASTY (Right) Patient reports pain as mild.   Patient seen in rounds with Dr. [name].  Daughter in room at bedside. Patient is well, but has had some minor complaints of pain in the knee, requiring pain medications We will start therapy today.  Plan is to go Home after hospital stay.  Objective: Vital signs in last 24 hours: Temp:  [97.3 F (36.3 C)-98.3 F (36.8 C)] 98.3 F (36.8 C) (05/23 0500) Pulse Rate:  [62-92] 64 (05/23 0623) Resp:  [14-25] 18 (05/23 0623) BP: (102-146)/(52-86) 122/68 mmHg (05/23 0807) SpO2:  [98 %-100 %] 100 % (05/23 0623) Weight:  [81.251 kg (179 lb 2 oz)] 81.251 kg (179 lb 2 oz) (05/22 0952)  Intake/Output from previous day:  Intake/Output Summary (Last 24 hours) at 11/11/15 0826 Last data filed at 11/11/15 [id number]  Gross per 24 hour  Intake 4269.99 ml  Output   1890 ml  Net 2379.99 ml    Labs:  Recent Labs  11/11/15 0404  HGB 10.3*    Recent Labs  11/11/15 0404  WBC 10.8*  RBC 3.65*  HCT 31.4*  PLT 166    Recent Labs  11/11/15 0404  NA 137  K 4.3  CL 105  CO2 27  BUN 11  CREATININE 0.65  GLUCOSE 179*  CALCIUM 8.6*   No results for input(s): LABPT, INR in the last 72 hours.  EXAM General - Patient is Alert, Appropriate and Oriented Extremity - Neurovascular intact Sensation intact distally Dorsiflexion/Plantar flexion intact Dressing - dressing C/D/I Motor Function - intact, moving foot and toes well on exam.  Hemovac pulled without difficulty.  Past Medical History  Diagnosis Date  . Hypertension   . Hyperlipidemia   . Anxiety   . DJD (degenerative joint disease) of knee   . Gout   . HSV-1 (herpes simplex virus 1) infection   . History of right shoulder fracture   . History of weight change     lost 30 lbs over last 5 years   . Fatigue   . Balance problems   . Back pain     severe  . MRSA (methicillin resistant staph aureus) culture  positive   . Insomnia   . Numbness and tingling     feet bilat   . Wears glasses   . History of blood transfusion   . Elevated cholesterol     Assessment/Plan: 1 Day Post-Op Procedure(s) (LRB): TOTAL RIGHT KNEE ARTHROPLASTY (Right) Active Problems:   OA (osteoarthritis) of knee  Estimated body mass index is 24.99 kg/(m^2) as calculated from the following:   Height as of this encounter: 5\' 11"  (1.803 m).   Weight as of this encounter: 81.251 kg (179 lb 2 oz). Advance diet Up with therapy Plan for discharge tomorrow Discharge home with home health  DVT Prophylaxis - Aspirin 325 mg  Weight-Bearing as tolerated to right leg D/C O2 and Pulse OX and try on Room Air  Arlee Muslim, PA-C Orthopaedic Surgery 11/11/2015, 8:26 AM

## 2015-11-11 NOTE — Progress Notes (Signed)
OT Cancellation Note  Patient Details Name: Shaun Carey MRN: [id number] DOB: 15-May-1941   Cancelled Treatment:    Reason Eval/Treat Not Completed: OT screened, no needs identified, will sign off  Leba Tibbitts A 11/11/2015, 1:30 PM

## 2015-11-11 NOTE — Progress Notes (Signed)
Physical Therapy Treatment Patient Details Name: Shaun Carey MRN: [id number] DOB: [birth year] Today's Date: 11/11/2015    History of Present Illness 76 yo male s/p R TKA 11/10/15. Hx of L TKA 06/2015    PT Comments    POD # 1 pm session Assisted with amb a greater distance then back to bed for CPM.  Follow Up Recommendations  Home health PT     Equipment Recommendations  None recommended by PT    Recommendations for Other Services       Precautions / Restrictions Precautions Precautions: Fall Required Braces or Orthoses: Knee Immobilizer - Right Knee Immobilizer - Right: Discontinue once straight leg raise with < 10 degree lag Restrictions Weight Bearing Restrictions: No RLE Weight Bearing: Weight bearing as tolerated    Mobility  Bed Mobility Overal bed mobility: Needs Assistance Bed Mobility: Supine to Sit     Supine to sit: Min guard;HOB elevated     General bed mobility comments: pt OOB in recliner  Transfers Overall transfer level: Needs assistance Equipment used: Rolling walker (2 wheeled) Transfers: Sit to/from Stand Sit to Stand: Min assist;Min guard         General transfer comment: close guard for safety. VCs hand placement.   Ambulation/Gait Ambulation/Gait assistance: Min guard;Supervision Ambulation Distance (Feet): 122 Feet Assistive device: Rolling walker (2 wheeled) Gait Pattern/deviations: Step-to pattern     General Gait Details: close guard for safety. slow gait speed. Pt tolerated distance well. VCs safety, sequence   Stairs            Wheelchair Mobility    Modified Rankin (Stroke Patients Only)       Balance                                    Cognition Arousal/Alertness: Awake/alert Behavior During Therapy: WFL for tasks assessed/performed Overall Cognitive Status: Within Functional Limits for tasks assessed                      Exercises Total Joint Exercises Ankle Circles/Pumps:  AROM;Both;10 reps;Supine Quad Sets: AROM;Both;10 reps;Supine Heel Slides: AAROM;Right;10 reps;Supine Hip ABduction/ADduction: AROM;Right;10 reps;Supine Straight Leg Raises: AROM;Right;10 reps;Supine Goniometric ROM: ~5-80 degrees    General Comments        Pertinent Vitals/Pain Pain Assessment: 0-10 Pain Score: 5  Pain Location: R knee Pain Descriptors / Indicators: Sore Pain Intervention(s): Monitored during session;Premedicated before session;Repositioned;Ice applied    Home Living Family/patient expects to be discharged to:: Private residence Living Arrangements: Alone Available Help at Discharge: Family;Available 24 hours/day Type of Home: House Home Access: Stairs to enter Entrance Stairs-Rails: None Home Layout: One level Home Equipment: Environmental consultant - 2 wheels;Cane - single point;Bedside commode      Prior Function Level of Independence: Independent          PT Goals (current goals can now be found in the care plan section) Acute Rehab PT Goals Patient Stated Goal: to regain PLOF. Get back to silver sneakers PT Goal Formulation: With patient Time For Goal Achievement: 11/18/15 Potential to Achieve Goals: Good    Frequency  7X/week    PT Plan      Co-evaluation             End of Session Equipment Utilized During Treatment: Right knee immobilizer;Gait belt Activity Tolerance: Patient tolerated treatment well Patient left: in chair;with call bell/phone within reach;with family/visitor present  Time: [id number] PT Time Calculation (min) (ACUTE ONLY): 17 min  Charges:  $Gait Training: 8-22 mins                    G Codes:      Rica Koyanagi  PTA [facility]  Acute  Rehab Pager      [phone]

## 2015-11-11 NOTE — Evaluation (Signed)
Physical Therapy Evaluation Patient Details Name: Shaun Carey MRN: [id number] DOB: 29-Nov-1940 Today's Date: 11/11/2015   History of Present Illness  75 yo male s/p R TKA 11/10/15. Hx of L TKA 06/2015  Clinical Impression  On eval, pt was Min guard assist for mobility-walked ~115 feet with RW. Pain rated 6/10. Pt tolerated activity well. Will follow and progress activity as tolerated.     Follow Up Recommendations Home health PT    Equipment Recommendations  None recommended by PT    Recommendations for Other Services       Precautions / Restrictions Precautions Precautions: Fall Required Braces or Orthoses: Knee Immobilizer - Right Knee Immobilizer - Right: Discontinue once straight leg raise with < 10 degree lag Restrictions Weight Bearing Restrictions: No RLE Weight Bearing: Weight bearing as tolerated      Mobility  Bed Mobility Overal bed mobility: Needs Assistance Bed Mobility: Supine to Sit     Supine to sit: Min guard;HOB elevated     General bed mobility comments: close guard for safety.   Transfers Overall transfer level: Needs assistance Equipment used: Rolling walker (2 wheeled) Transfers: Sit to/from Stand Sit to Stand: Min guard         General transfer comment: close guard for safety. VCs hand placement.   Ambulation/Gait Ambulation/Gait assistance: Min guard Ambulation Distance (Feet): 115 Feet Assistive device: Rolling walker (2 wheeled) Gait Pattern/deviations: Step-to pattern;Antalgic     General Gait Details: close guard for safety. slow gait speed. Pt tolerated distance well. VCs safety, sequence  Stairs            Wheelchair Mobility    Modified Rankin (Stroke Patients Only)       Balance                                             Pertinent Vitals/Pain Pain Assessment: 0-10 Pain Score: 6  Pain Location: R knee with activity Pain Descriptors / Indicators: Sore Pain Intervention(s): Monitored  during session;Ice applied;Repositioned    Home Living Family/patient expects to be discharged to:: Private residence Living Arrangements: Alone Available Help at Discharge: Family;Available 24 hours/day Type of Home: House Home Access: Stairs to enter Entrance Stairs-Rails: None Entrance Stairs-Number of Steps: 2 Home Layout: One level Home Equipment: Walker - 2 wheels;Cane - single point;Bedside commode      Prior Function Level of Independence: Independent               Hand Dominance        Extremity/Trunk Assessment   Upper Extremity Assessment: Overall WFL for tasks assessed           Lower Extremity Assessment: RLE deficits/detail RLE Deficits / Details: at least 3/5 throughout    Cervical / Trunk Assessment: Normal  Communication   Communication: No difficulties  Cognition Arousal/Alertness: Awake/alert Behavior During Therapy: WFL for tasks assessed/performed Overall Cognitive Status: Within Functional Limits for tasks assessed                      General Comments      Exercises Total Joint Exercises Ankle Circles/Pumps: AROM;Both;10 reps;Supine Quad Sets: AROM;Both;10 reps;Supine Heel Slides: AAROM;Right;10 reps;Supine Hip ABduction/ADduction: AROM;Right;10 reps;Supine Straight Leg Raises: AROM;Right;10 reps;Supine Goniometric ROM: ~5-80 degrees      Assessment/Plan    PT Assessment Patient needs continued PT services  PT Diagnosis Difficulty  walking;Acute pain   PT Problem List Decreased strength;Decreased range of motion;Decreased activity tolerance;Decreased balance;Decreased mobility;Pain;Decreased knowledge of use of DME  PT Treatment Interventions DME instruction;Gait training;Stair training;Functional mobility training;Therapeutic activities;Patient/family education;Therapeutic exercise;Balance training   PT Goals (Current goals can be found in the Care Plan section) Acute Rehab PT Goals Patient Stated Goal: to regain  PLOF. Get back to silver sneakers PT Goal Formulation: With patient Time For Goal Achievement: 11/18/15 Potential to Achieve Goals: Good    Frequency 7X/week   Barriers to discharge        Co-evaluation               End of Session Equipment Utilized During Treatment: Right knee immobilizer;Gait belt Activity Tolerance: Patient tolerated treatment well Patient left: in chair;with call bell/phone within reach;with family/visitor present           Time: [id number] PT Time Calculation (min) (ACUTE ONLY): 31 min   Charges:   PT Evaluation $PT Eval Low Complexity: 1 Procedure PT Treatments $Gait Training: 8-22 mins   PT G Codes:        Weston Anna, MPT Pager: [phone]

## 2015-11-11 NOTE — Care Management Note (Signed)
Case Management Note  Patient Details  Name: Shaun Carey MRN: [id number] Date of Birth: 01-30-1941  Subjective/Objective:                  TOTAL RIGHT KNEE ARTHROPLASTY (Right) Action/Plan: Discharge planning Expected Discharge Date:  11/12/15               Expected Discharge Plan:  [place]  In-House Referral:     Discharge planning Services  CM Consult  Post Acute Care Choice:    Choice offered to:  Patient  DME Arranged:  N/A DME Agency:  NA  HH Arranged:  PT HH Agency:  [facility]  Status of Service:  Completed, signed off  Medicare Important Message Given:    Date Medicare IM Given:    Medicare IM give by:    Date Additional Medicare IM Given:    Additional Medicare Important Message give by:     If discussed at [place] of Stay Meetings, dates discussed:    Additional Comments: CM met with pt in room to offer choice of home health agency.  Pt chooses [facility] to render home health physical therapy and specifically requests Junie Panning as the PT.  Referral given to [facility] rep, Tim with request for [employer] [facility] [employer].  Pt has 3n1 and rolling walker from previous surgery.  No other CM needs were communicated. Dellie Catholic, RN 11/11/2015, 11:26 AM

## 2015-11-12 LAB — CBC
HCT: 27.1 % — ABNORMAL LOW (ref 39.0–52.0)
Hemoglobin: 9 g/dL — ABNORMAL LOW (ref 13.0–17.0)
MCH: 28.7 pg (ref 26.0–34.0)
MCHC: 33.2 g/dL (ref 30.0–36.0)
MCV: 86.3 fL (ref 78.0–100.0)
PLATELETS: 160 10*3/uL (ref 150–400)
RBC: 3.14 MIL/uL — ABNORMAL LOW (ref 4.22–5.81)
RDW: 15.9 % — ABNORMAL HIGH (ref 11.5–15.5)
WBC: 13.5 10*3/uL — ABNORMAL HIGH (ref 4.0–10.5)

## 2015-11-12 LAB — BASIC METABOLIC PANEL
Anion gap: 4 — ABNORMAL LOW (ref 5–15)
BUN: 11 mg/dL (ref 6–20)
CO2: 29 mmol/L (ref 22–32)
CREATININE: 0.74 mg/dL (ref 0.61–1.24)
Calcium: 8.8 mg/dL — ABNORMAL LOW (ref 8.9–10.3)
Chloride: 105 mmol/L (ref 101–111)
GFR calc Af Amer: >60 mL/min (ref >60)
Glucose, Bld: 119 mg/dL — ABNORMAL HIGH (ref 65–99)
Potassium: 4.1 mmol/L (ref 3.5–5.1)
SODIUM: 138 mmol/L (ref 135–145)

## 2015-11-12 NOTE — Progress Notes (Signed)
Physical Therapy Treatment Patient Details Name: Shaun Carey MRN: [id number] DOB: [birth year] Today's Date: 11/12/2015    History of Present Illness 75 yo male s/p R TKA 11/10/15. Hx of L TKA 06/2015    PT Comments    Progressing well with mobility. Practiced/reviewed exercises, gait training, and stair training. All education completed. Ready to d/c from PT standpoint-made RN aware.   Follow Up Recommendations  Home health PT     Equipment Recommendations  None recommended by PT    Recommendations for Other Services       Precautions / Restrictions Precautions Precautions: Fall Required Braces or Orthoses: Knee Immobilizer - Right Knee Immobilizer - Right: Discontinue once straight leg raise with < 10 degree lag (able to SLR 5/24) Restrictions Weight Bearing Restrictions: No RLE Weight Bearing: Weight bearing as tolerated    Mobility  Bed Mobility Overal bed mobility: Needs Assistance Bed Mobility: Supine to Sit     Supine to sit: Min guard     General bed mobility comments: close guard for safety.   Transfers Overall transfer level: Needs assistance Equipment used: Rolling walker (2 wheeled) Transfers: Sit to/from Stand Sit to Stand: Min guard         General transfer comment: close guard for safety. VCs hand placement.   Ambulation/Gait Ambulation/Gait assistance: Min guard Ambulation Distance (Feet): 100 Feet Assistive device: Rolling walker (2 wheeled) Gait Pattern/deviations: Step-through pattern;Step-to pattern;Trunk flexed;Decreased stride length;Antalgic     General Gait Details: close guard for safety. slow gait speed. Pt tolerated distance well. VCs safety, sequence   Stairs Stairs: Yes Stairs assistance: Min assist Stair Management: Backwards;With walker;Step to pattern Number of Stairs: 2 General stair comments: Assist to stabilize walker. Daughter present to observe and assist with walker. VCs safety, technique, sequence.    Wheelchair Mobility    Modified Rankin (Stroke Patients Only)       Balance                                    Cognition Arousal/Alertness: Awake/alert Behavior During Therapy: WFL for tasks assessed/performed Overall Cognitive Status: Within Functional Limits for tasks assessed                      Exercises Total Joint Exercises Ankle Circles/Pumps: AROM;Both;10 reps;Supine Quad Sets: AROM;Both;10 reps;Supine Heel Slides: AAROM;Right;10 reps;Supine Hip ABduction/ADduction: AROM;Right;10 reps;Supine Straight Leg Raises: AROM;Right;10 reps;Supine Goniometric ROM: ~5-80 degrees    General Comments        Pertinent Vitals/Pain Pain Assessment: 0-10 Pain Score: 6  Pain Location: R knee Pain Descriptors / Indicators: Sore Pain Intervention(s): Monitored during session;Ice applied;Repositioned    Home Living                      Prior Function            PT Goals (current goals can now be found in the care plan section) Progress towards PT goals: Progressing toward goals    Frequency  7X/week    PT Plan Current plan remains appropriate    Co-evaluation             End of Session Equipment Utilized During Treatment: Gait belt Activity Tolerance: Patient tolerated treatment well Patient left: in chair;with call bell/phone within reach;with family/visitor present     Time: 0901-0929 PT Time Calculation (min) (ACUTE ONLY): 28 min  Charges:  $Gait Training:  8-22 mins $Therapeutic Exercise: 8-22 mins                    G Codes:      Weston Anna, MPT Pager: [phone]

## 2015-11-12 NOTE — Progress Notes (Signed)
   Subjective: 2 Days Post-Op Procedure(s) (LRB): TOTAL RIGHT KNEE ARTHROPLASTY (Right) Patient reports pain as mild.   Patient seen in rounds with Dr. [name].  Ready to go home. Patient is well, but has had some minor complaints of pain in the knee, requiring pain medications Patient is ready to go home  Objective: Vital signs in last 24 hours: Temp:  [97.7 F (36.5 C)-98.5 F (36.9 C)] 98.2 F (36.8 C) (05/24 0500) Pulse Rate:  [63-74] 74 (05/24 0500) Resp:  [17-18] 18 (05/24 0500) BP: (122-138)/(61-76) 136/61 mmHg (05/24 0500) SpO2:  [95 %-98 %] 95 % (05/24 0500)  Intake/Output from previous day:  Intake/Output Summary (Last 24 hours) at 11/12/15 0659 Last data filed at 11/12/15 0616  Gross per 24 hour  Intake   1764 ml  Output   2625 ml  Net   -861 ml    Intake/Output this shift: Total I/O In: 570 [P.O.:570] Out: 1825 [Urine:1825]  Labs:  Recent Labs  11/11/15 0404 11/12/15 0401  HGB 10.3* 9.0*    Recent Labs  11/11/15 0404 11/12/15 0401  WBC 10.8* 13.5*  RBC 3.65* 3.14*  HCT 31.4* 27.1*  PLT 166 160    Recent Labs  11/11/15 0404 11/12/15 0401  NA 137 138  K 4.3 4.1  CL 105 105  CO2 27 29  BUN 11 11  CREATININE 0.65 0.74  GLUCOSE 179* 119*  CALCIUM 8.6* 8.8*   No results for input(s): LABPT, INR in the last 72 hours.  EXAM: General - Patient is Alert, Appropriate and Oriented Extremity - Neurovascular intact Sensation intact distally Dorsiflexion/Plantar flexion intact Incision - clean, dry, no drainage, small blister Motor Function - intact, moving foot and toes well on exam.  Assessment/Plan: 2 Days Post-Op Procedure(s) (LRB): TOTAL RIGHT KNEE ARTHROPLASTY (Right) Procedure(s) (LRB): TOTAL RIGHT KNEE ARTHROPLASTY (Right) Past Medical History  Diagnosis Date  . Hypertension   . Hyperlipidemia   . Anxiety   . DJD (degenerative joint disease) of knee   . Gout   . HSV-1 (herpes simplex virus 1) infection   . History of right  shoulder fracture   . History of weight change     lost 30 lbs over last 5 years   . Fatigue   . Balance problems   . Back pain     severe  . MRSA (methicillin resistant staph aureus) culture positive   . Insomnia   . Numbness and tingling     feet bilat   . Wears glasses   . History of blood transfusion   . Elevated cholesterol    Active Problems:   OA (osteoarthritis) of knee  Estimated body mass index is 24.99 kg/(m^2) as calculated from the following:   Height as of this encounter: 5\' 11"  (1.803 m).   Weight as of this encounter: 81.251 kg (179 lb 2 oz). Up with therapy Discharge home with home health Diet - Cardiac diet Follow up - in 2 weeks Activity - WBAT Disposition - Home Condition Upon Discharge - Good D/C Meds - See DC Summary DVT Prophylaxis - Aspirin  Arlee Muslim, PA-C Orthopaedic Surgery 11/12/2015, 6:59 AM

## 2015-11-15 ENCOUNTER — Emergency Department (HOSPITAL_COMMUNITY)
Admission: EM | Admit: 2015-11-15 | Discharge: 2015-11-15 | Disposition: A | Discharge status: Home / Self Care | Payer: Medicare Other | Attending: Emergency Medicine | Admitting: Emergency Medicine

## 2015-11-15 ENCOUNTER — Encounter (HOSPITAL_COMMUNITY): Payer: Self-pay | Admitting: Oncology

## 2015-11-15 ENCOUNTER — Emergency Department (HOSPITAL_BASED_OUTPATIENT_CLINIC_OR_DEPARTMENT_OTHER)
Admit: 2015-11-15 | Discharge: 2015-11-15 | Disposition: A | Discharge status: Home / Self Care | Payer: Medicare Other | Attending: Emergency Medicine | Admitting: Emergency Medicine

## 2015-11-15 DIAGNOSIS — D500 Iron deficiency anemia secondary to blood loss (chronic): Secondary | ICD-10-CM | POA: Diagnosis not present

## 2015-11-15 DIAGNOSIS — M79604 Pain in right leg: Secondary | ICD-10-CM

## 2015-11-15 DIAGNOSIS — Z7951 Long term (current) use of inhaled steroids: Secondary | ICD-10-CM | POA: Insufficient documentation

## 2015-11-15 DIAGNOSIS — Z91040 Latex allergy status: Secondary | ICD-10-CM | POA: Insufficient documentation

## 2015-11-15 DIAGNOSIS — I10 Essential (primary) hypertension: Secondary | ICD-10-CM | POA: Diagnosis not present

## 2015-11-15 DIAGNOSIS — Z96651 Presence of right artificial knee joint: Secondary | ICD-10-CM | POA: Insufficient documentation

## 2015-11-15 DIAGNOSIS — M7989 Other specified soft tissue disorders: Secondary | ICD-10-CM | POA: Diagnosis present

## 2015-11-15 DIAGNOSIS — Z792 Long term (current) use of antibiotics: Secondary | ICD-10-CM | POA: Insufficient documentation

## 2015-11-15 DIAGNOSIS — Z7982 Long term (current) use of aspirin: Secondary | ICD-10-CM | POA: Diagnosis not present

## 2015-11-15 DIAGNOSIS — Z79899 Other long term (current) drug therapy: Secondary | ICD-10-CM | POA: Insufficient documentation

## 2015-11-15 DIAGNOSIS — Z96652 Presence of left artificial knee joint: Secondary | ICD-10-CM | POA: Insufficient documentation

## 2015-11-15 LAB — CBC WITH DIFFERENTIAL/PLATELET
Basophils Absolute: 0 10*3/uL (ref 0.0–0.1)
Basophils Relative: 0 %
EOS ABS: 0.1 10*3/uL (ref 0.0–0.7)
EOS PCT: 1 %
HCT: 20.6 % — ABNORMAL LOW (ref 39.0–52.0)
HEMOGLOBIN: 7 g/dL — AB (ref 13.0–17.0)
Lymphocytes Relative: 20 %
Lymphs Abs: 1.8 10*3/uL (ref 0.7–4.0)
MCH: 29 pg (ref 26.0–34.0)
MCHC: 34 g/dL (ref 30.0–36.0)
MCV: 85.5 fL (ref 78.0–100.0)
MONO ABS: 1.6 10*3/uL — AB (ref 0.1–1.0)
Monocytes Relative: 18 %
Neutro Abs: 5.7 10*3/uL (ref 1.7–7.7)
Neutrophils Relative %: 61 %
Platelets: 195 10*3/uL (ref 150–400)
RBC: 2.41 MIL/uL — ABNORMAL LOW (ref 4.22–5.81)
RDW: 15.3 % (ref 11.5–15.5)
WBC: 9.2 10*3/uL (ref 4.0–10.5)

## 2015-11-15 LAB — BASIC METABOLIC PANEL
Anion gap: 6 (ref 5–15)
BUN: 10 mg/dL (ref 6–20)
CALCIUM: 8.4 mg/dL — AB (ref 8.9–10.3)
CO2: 27 mmol/L (ref 22–32)
CREATININE: 0.66 mg/dL (ref 0.61–1.24)
Chloride: 103 mmol/L (ref 101–111)
GFR calc Af Amer: >60 mL/min (ref >60)
GLUCOSE: 119 mg/dL — AB (ref 65–99)
Potassium: 3.7 mmol/L (ref 3.5–5.1)
SODIUM: 136 mmol/L (ref 135–145)

## 2015-11-15 LAB — SEDIMENTATION RATE: SED RATE: 112 mm/h — AB (ref 0–16)

## 2015-11-15 MED ORDER — METHOCARBAMOL 500 MG PO TABS
500.0000 mg | ORAL_TABLET | Freq: Once | ORAL | Status: AC
  Administered 2015-11-15: 500 mg via ORAL
  Filled 2015-11-15: qty 1

## 2015-11-15 MED ORDER — FERROUS SULFATE 325 (65 FE) MG PO TABS
325.0000 mg | ORAL_TABLET | Freq: Once | ORAL | Status: DC
  Filled 2015-11-15: qty 1

## 2015-11-15 MED ORDER — ACYCLOVIR 200 MG PO CAPS
400.0000 mg | ORAL_CAPSULE | Freq: Once | ORAL | Status: DC
  Filled 2015-11-15: qty 2

## 2015-11-15 MED ORDER — HYDROMORPHONE HCL 2 MG PO TABS
4.0000 mg | ORAL_TABLET | Freq: Once | ORAL | Status: AC
  Administered 2015-11-15: 4 mg via ORAL
  Filled 2015-11-15: qty 2

## 2015-11-15 MED ORDER — BENAZEPRIL HCL 40 MG PO TABS
40.0000 mg | ORAL_TABLET | Freq: Every day | ORAL | Status: DC
  Filled 2015-11-15: qty 1

## 2015-11-15 MED ORDER — AMLODIPINE BESYLATE 5 MG PO TABS
5.0000 mg | ORAL_TABLET | Freq: Once | ORAL | Status: DC
  Filled 2015-11-15: qty 1

## 2015-11-15 NOTE — ED Notes (Signed)
Dr. Roxanne Mins is speaking with him; and tells him he will notify orthopaedist. Pt. Is in no distress.

## 2015-11-15 NOTE — ED Notes (Signed)
Right knee wrapped with Xeroform dressing; and I re-used his non-latex Ace wrap. He is d/c'd. To his vehicle per w/c without incident.

## 2015-11-15 NOTE — Progress Notes (Signed)
VASCULAR LAB PRELIMINARY  PRELIMINARY  PRELIMINARY  PRELIMINARY  Right lower extremity venous duplex completed.    Preliminary report:  There is no DVT or SVT noted in the right lower extremity.  There are enlarged lymph nodes noted in the bilateral groins.  Donya Hitch, RVT 11/15/2015, 10:07 AM

## 2015-11-15 NOTE — Consult Note (Signed)
FULP, CAMMIE, MD Chief Complaint: Right TKR wound History: The history is provided by the patient.  75 year old male with history of hypertension, hyperlipidemia, MRSA positive comes in 5 days following right total knee replacement. He states that he was running low-grade fevers at home but does not know how high his temperature was. He denies chills or sweats. He states that he generally feels well. There has been swelling in his right leg which does seem to respond to leg elevation. Postoperative pain has been reasonably controlled with prescribed analgesics. He is concerned because he did have a MRSA infection following left total knee replacement in January. Past Medical History  Diagnosis Date  . Hypertension   . Hyperlipidemia   . Anxiety   . DJD (degenerative joint disease) of knee   . Gout   . HSV-1 (herpes simplex virus 1) infection   . History of right shoulder fracture   . History of weight change     lost 30 lbs over last 5 years   . Fatigue   . Balance problems   . Back pain     severe  . Insomnia   . Numbness and tingling     feet bilat   . Wears glasses   . History of blood transfusion   . Elevated cholesterol     Allergies  Allergen Reactions  . Other Rash    STERI STRIPS  . Latex Other (See Comments)    Fever, complications from previous admission due to reactions to Oxycodone and Xarelto   . Oxycodone Other (See Comments)    Back broke out in pustules  . Xarelto [Rivaroxaban] Other (See Comments)    Excessive bleeding, back broke out in blue pustules    No current facility-administered medications on file prior to encounter.   Current Outpatient Prescriptions on File Prior to Encounter  Medication Sig Dispense Refill  . acyclovir (ZOVIRAX) 400 MG tablet Take 1 tablet (400 mg total) by mouth 2 (two) times daily. 180 tablet 3  . allopurinol (ZYLOPRIM) 300 MG tablet Take 1 tablet (300 mg total) by mouth daily. 90 tablet 3  . ALPRAZolam (XANAX) 0.5 MG  tablet Take 1 tablet (0.5 mg total) by mouth 3 (three) times daily as needed for sleep. 270 tablet 3  . amLODipine (NORVASC) 5 MG tablet Take 5 mg by mouth daily.    Marland Kitchen aspirin EC 325 MG EC tablet Take 1 tablet (325 mg total) by mouth 2 (two) times daily. Take a 325 mg Aspirin twice a day for three weeks and then reduce to a baby 81 mg Aspirin daily for three additional weeks. 38 tablet 0  . benazepril (LOTENSIN) 40 MG tablet Take 1 tablet (40 mg total) by mouth daily. 90 tablet 3  . docusate sodium (COLACE) 100 MG capsule Take 100 mg by mouth daily as needed for mild constipation.    Marland Kitchen HYDROmorphone (DILAUDID) 2 MG tablet Take 1-2 tablets (2-4 mg total) by mouth every 4 (four) hours as needed for moderate pain or severe pain. 80 tablet 0  . methocarbamol (ROBAXIN) 500 MG tablet Take 1 tablet (500 mg total) by mouth every 6 (six) hours as needed for muscle spasms. 80 tablet 0  . polyethylene glycol (MIRALAX / GLYCOLAX) packet Take 17 g by mouth daily. (Patient taking differently: Take 17 g by mouth daily as needed (For constipation.). ) 14 each 0  . simvastatin (ZOCOR) 20 MG tablet Take 1 tablet (20 mg total) by mouth at bedtime. 90 tablet  1  . traMADol (ULTRAM) 50 MG tablet Take 1-2 tablets (50-100 mg total) by mouth every 6 (six) hours as needed (mild pain). 90 tablet 1  . carvedilol (COREG CR) 10 MG 24 hr capsule Take 1 capsule (10 mg total) by mouth daily. (Patient not taking: Reported on 10/22/2015) 30 capsule 0  . fluticasone (FLONASE) 50 MCG/ACT nasal spray Place 2 sprays into the nose as directed. (Patient not taking: Reported on 11/15/2015) 16 g 3  . minocycline (MINOCIN,DYNACIN) 100 MG capsule Take 1 capsule (100 mg total) by mouth 2 (two) times daily. (Patient not taking: Reported on 10/22/2015) 8 capsule 0    Physical Exam: Filed Vitals:   11/15/15 0351 11/15/15 1106  BP: 110/72 126/56  Pulse: 94 83  Temp: 98.7 F (37.1 C) 99.4 F (37.4 C)  Resp: 14 18  A+O X3 NVI Compartments  soft/NT Wound: C/D/I  No drainage or erythema  Resolving bruising 2nd to recent surgery lungs CTA EHL/TA/GA intact DVT scan: negative  WBC: 9.2 HCT: 20.6 HG: 7.0  A/P:  Patient s/p right TKR Seen today for question of infection Wound appears benign No signs of infection Anemia noted.  However patient making good urine, no tachycardia, no clinical signs of issue Recheck of HCT in AM May require transfusion if it continues to decline or symptoms develop  Will notify Dr Maureen Ralphs of current condition

## 2015-11-15 NOTE — ED Notes (Signed)
Pt has no real c/o.  States that his children are scared that he may developed a surgical infection as he did w/ the last knee replacement.  Pt is A&O x 4.  Denies pain.

## 2015-11-15 NOTE — ED Provider Notes (Signed)
CSN: [id number]     Arrival date & time 11/15/15  0309 History   First MD Initiated Contact with Patient 11/15/15 0355     Chief Complaint  Patient presents with  . Post op check      (Consider location/radiation/quality/duration/timing/severity/associated sxs/prior Treatment) The history is provided by the patient.  75 year old male with history of hypertension, hyperlipidemia, MRSA positive comes in 5 days following right total knee replacement. He states that he was running low-grade fevers at home but does not know how high his temperature was. He denies chills or sweats. He states that he generally feels well. There has been swelling in his right leg which does seem to respond to leg elevation. Postoperative pain has been reasonably controlled with prescribed analgesics. He is concerned because he did have a MRSA infection following left total knee replacement in January.  Past Medical History  Diagnosis Date  . Hypertension   . Hyperlipidemia   . Anxiety   . DJD (degenerative joint disease) of knee   . Gout   . HSV-1 (herpes simplex virus 1) infection   . History of right shoulder fracture   . History of weight change     lost 30 lbs over last 5 years   . Fatigue   . Balance problems   . Back pain     severe  . MRSA (methicillin resistant staph aureus) culture positive   . Insomnia   . Numbness and tingling     feet bilat   . Wears glasses   . History of blood transfusion   . Elevated cholesterol    Past Surgical History  Procedure Laterality Date  . Vasectomy  '86  . Tonsillectomy and adenoidectomy    . Tonsillectomy    . Naval hernia correction       Dr Lucia Bitter approx 10 years ago   . Total knee arthroplasty Left 07/07/2015    Procedure: LEFT TOTAL KNEE ARTHROPLASTY;  Surgeon: Gaynelle Arabian, MD;  Location: [facility];  Service: Orthopedics;  Laterality: Left;  . Rotator cuff repair      right   . Total knee arthroplasty Right 11/10/2015    Procedure: TOTAL RIGHT KNEE  ARTHROPLASTY;  Surgeon: Gaynelle Arabian, MD;  Location: [facility];  Service: Orthopedics;  Laterality: Right;   Family History  Problem Relation Age of Onset  . Alzheimer's disease Mother   . Glaucoma Mother   . Cancer Neg Hx   . Diabetes Neg Hx   . Heart disease Brother     murmur   Social History  Substance Use Topics  . Smoking status: Never Smoker   . Smokeless tobacco: Never Used  . Alcohol Use: 1.0 oz/week    2 Standard drinks or equivalent per week     Comment: occas    Review of Systems  All other systems reviewed and are negative.     Allergies  Other; Latex; Oxycodone; and Xarelto  Home Medications   Prior to Admission medications   Medication Sig Start Date End Date Taking? Authorizing Provider  acyclovir (ZOVIRAX) 400 MG tablet Take 1 tablet (400 mg total) by mouth 2 (two) times daily. 12/28/12   Neena Rhymes, MD  allopurinol (ZYLOPRIM) 300 MG tablet Take 1 tablet (300 mg total) by mouth daily. 12/28/12   Neena Rhymes, MD  ALPRAZolam Duanne Moron) 0.5 MG tablet Take 1 tablet (0.5 mg total) by mouth 3 (three) times daily as needed for sleep. 12/28/12   Neena Rhymes, MD  amLODipine (NORVASC) 5 MG tablet Take 5 mg by mouth daily.    Historical Provider, MD  aspirin EC 325 MG EC tablet Take 1 tablet (325 mg total) by mouth 2 (two) times daily. Take a 325 mg Aspirin twice a day for three weeks and then reduce to a baby 81 mg Aspirin daily for three additional weeks. 11/11/15   Arlee Muslim, PA-C  benazepril (LOTENSIN) 40 MG tablet Take 1 tablet (40 mg total) by mouth daily. 12/28/12   Neena Rhymes, MD  carvedilol (COREG CR) 10 MG 24 hr capsule Take 1 capsule (10 mg total) by mouth daily. Patient not taking: Reported on 10/22/2015 07/14/15   Nita Sells, MD  docusate sodium (COLACE) 100 MG capsule Take 100 mg by mouth daily as needed for mild constipation.    Historical Provider, MD  fluticasone (FLONASE) 50 MCG/ACT nasal spray Place 2 sprays into the nose as  directed. Patient taking differently: Place 2 sprays into the nose daily as needed for allergies or rhinitis.  12/28/12   Neena Rhymes, MD  HYDROmorphone (DILAUDID) 2 MG tablet Take 1-2 tablets (2-4 mg total) by mouth every 4 (four) hours as needed for moderate pain or severe pain. 11/11/15   Arlee Muslim, PA-C  methocarbamol (ROBAXIN) 500 MG tablet Take 1 tablet (500 mg total) by mouth every 6 (six) hours as needed for muscle spasms. 11/11/15   Arlee Muslim, PA-C  minocycline (MINOCIN,DYNACIN) 100 MG capsule Take 1 capsule (100 mg total) by mouth 2 (two) times daily. Patient not taking: Reported on 10/22/2015 07/14/15   Nita Sells, MD  polyethylene glycol (MIRALAX / GLYCOLAX) packet Take 17 g by mouth daily. Patient taking differently: Take 17 g by mouth daily as needed (For constipation.).  07/14/15   Nita Sells, MD  simvastatin (ZOCOR) 20 MG tablet Take 1 tablet (20 mg total) by mouth at bedtime. 12/28/12   Neena Rhymes, MD  traMADol (ULTRAM) 50 MG tablet Take 1-2 tablets (50-100 mg total) by mouth every 6 (six) hours as needed (mild pain). 11/11/15   Arlee Muslim, PA-C   BP 110/72 mmHg  Pulse 94  Temp(Src) 98.7 F (37.1 C) (Oral)  Resp 14  SpO2 98% Physical Exam  Nursing note and vitals reviewed.  75 year old male, resting comfortably and in no acute distress. Vital signs are normal. Oxygen saturation is 98%, which is normal. Head is normocephalic and atraumatic. PERRLA, EOMI. Oropharynx is clear. Neck is nontender and supple without adenopathy or JVD. Back is nontender and there is no CVA tenderness. Lungs are clear without rales, wheezes, or rhonchi. Chest is nontender. Heart has regular rate and rhythm without murmur. Abdomen is soft, flat, nontender without masses or hepatosplenomegaly and peristalsis is normoactive. Extremities: Right knee has dressing in place. Dressing is removed and incision appears to be healing up properly. There is moderate ecchymosis in  the right thigh, knee, lower leg. There is moderate soft tissue swelling with pitting edema which is 2 expected at this stage postoperatively. There is no significant tenderness. There is no tenderness over the inguinal vessels. Distal neurovascular exam is intact. Skin is warm and dry without rash. Neurologic: Mental status is normal, cranial nerves are intact, there are no motor or sensory deficits.  ED Course  Procedures (including critical care time) Labs Review Results for orders placed or performed during the hospital encounter of [id number]  Basic metabolic panel  Result Value Ref Range   Sodium 136 135 - 145 mmol/L  Potassium 3.7 3.5 - 5.1 mmol/L   Chloride 103 101 - 111 mmol/L   CO2 27 22 - 32 mmol/L   Glucose, Bld 119 (H) 65 - 99 mg/dL   BUN 10 6 - 20 mg/dL   Creatinine, Ser 0.66 0.61 - 1.24 mg/dL   Calcium 8.4 (L) 8.9 - 10.3 mg/dL   GFR calc non Af Amer >60 >60 mL/min   GFR calc Af Amer >60 >60 mL/min   Anion gap 6 5 - 15  CBC with Differential  Result Value Ref Range   WBC 9.2 4.0 - 10.5 K/uL   RBC 2.41 (L) 4.22 - 5.81 MIL/uL   Hemoglobin 7.0 (L) 13.0 - 17.0 g/dL   HCT 20.6 (L) 39.0 - 52.0 %   MCV 85.5 78.0 - 100.0 fL   MCH 29.0 26.0 - 34.0 pg   MCHC 34.0 30.0 - 36.0 g/dL   RDW 15.3 11.5 - 15.5 %   Platelets 195 150 - 400 K/uL   Neutrophils Relative % 61 %   Neutro Abs 5.7 1.7 - 7.7 K/uL   Lymphocytes Relative 20 %   Lymphs Abs 1.8 0.7 - 4.0 K/uL   Monocytes Relative 18 %   Monocytes Absolute 1.6 (H) 0.1 - 1.0 K/uL   Eosinophils Relative 1 %   Eosinophils Absolute 0.1 0.0 - 0.7 K/uL   Basophils Relative 0 %   Basophils Absolute 0.0 0.0 - 0.1 K/uL  Sedimentation rate  Result Value Ref Range   Sed Rate 112 (H) 0 - 16 mm/hr   I have personally reviewed and evaluated these lab results as part of my medical decision-making.   MDM   Final diagnoses:  Right leg swelling  Anemia, blood loss    Postoperative swelling of the right leg to the degree that is  consistent with recent total knee arthroplasty. Old records are reviewed confirming recent total knee arthroplasty. I've also reviewed his records from his left total knee arthroplasty and subsequent hospitalization. That hospitalization was for possible cellulitis versus skin reaction to medications. He is noted to be MRSA positive on his problem list, but actual MRSA cultures came back negative twice. He was positive for MSSA. I didn't see any evidence of infection today, but will get CBC and sedimentation rate to look for evidence of occult infection.  Laboratory work-up shows hemoglobin has dropped from 9.0-7.0 which would account for his feeling weak. Sedimentation rate is elevated at 112, but has been over 141 he had been admitted in September. Family has arrived and they have multiple concerns including other he could have DVT because he is only taking aspirin for DVT prophylaxis. He is being sent for venous Doppler. I've discussed the case with Dr. Rolena Infante who is on-call for the patient's orthopedic surgeon. He agrees with obtaining venous Doppler and states that he is available to evaluate the patient if necessary. Case is signed out to Dr. Venora Maples.  Delora Fuel, MD [id number] [phone]

## 2015-11-15 NOTE — ED Notes (Signed)
He is in no distress and thanks me for the medication.

## 2015-11-15 NOTE — ED Notes (Signed)
Vascular tech returned my call, is aware of consult

## 2015-11-15 NOTE — ED Provider Notes (Signed)
Venous duplex negative. Well appearing. Seen by Dr Rolena Infante with orthopedics. Please see consultation note for complete details. Dc home with outpatient orthopedic follow-up.  Patient understands to return to the ER for new or worsening symptoms.  Hemoglobin 7.  Asymptomatic.  No indication for transfusion at this time.  He is having his hemoglobin rechecked by his orthopedist tomorrow  Jola Schmidt, MD 11/15/15 1134

## 2016-01-21 ENCOUNTER — Other Ambulatory Visit: Payer: Self-pay | Admitting: Oncology

## 2016-01-27 ENCOUNTER — Encounter: Payer: Self-pay | Admitting: Oncology

## 2016-01-27 ENCOUNTER — Ambulatory Visit (INDEPENDENT_AMBULATORY_CARE_PROVIDER_SITE_OTHER): Payer: Medicare Other | Admitting: Oncology

## 2016-01-27 DIAGNOSIS — R791 Abnormal coagulation profile: Secondary | ICD-10-CM | POA: Insufficient documentation

## 2016-01-27 HISTORY — DX: Abnormal coagulation profile: R79.1

## 2016-01-27 LAB — SAVE SMEAR

## 2016-01-27 NOTE — Progress Notes (Signed)
New Patient Hematology   Shaun Carey [id number] 08/01/1940 75 y.o. 01/27/2016  CC: Dr Gaynelle Arabian; Dr Antony Blackbird   Reason for referral: Evaluate unexplained excessive bleeding after knee replacement surgery   HPI:  75 year old man in overall excellent health with treated hypertension, hyperlipidemia, and gout. He has advanced degenerative arthritis of the knees and underwent initial left total knee replacement on 07/07/2015. His hemoglobin fell from  13.9 preop to 6 g requiring transfusion. He developed additional postop complications with a local wound cellulitis and high fever. There was some question as to whether or not he had a cutaneous reaction to Steri-Strips. He developed blisters over the wound site. He was initially started on Xarelto anticoagulation. This was stopped when he developed a diffuse rash on his back and due to an exaggerated fall in his hemoglobin and a large ecchymosis surrounding the wound site which she took a picture of and showed me.Marland Kitchen He recovered from that surgery and went on to have a right total knee replacement on 11/10/2015. Aspirin was used as postoperative thromboprophylaxis. Once again he had an exaggerated fall in his hemoglobin down to 7 g. Baseline coagulation studies were normal in January and again in May with pro time 14.4 seconds in January, 13.8 seconds in May, PTT 31 seconds in January, 30 seconds and May. Platelet count consistently normal. 195,000 on May 27. These were the first major surgical procedures he has ever had. He has had remote dental extractions with no excessive bleeding. Last time a tooth was pulled was about 20 years ago. He had a vasectomy in his late 40s. A tonsillectomy as a child. He admits to intermittent easy bruising. No gum bleeding. He did have low-grade epistaxis in the past when he took aspirin for more than 2 weeks. This only occurred when he was blowing his nose or putting a Q-tip cotton applicator up his nose. He has  no known liver disease and liver functions were normal recorded on May 12. No history of hepatitis, yellow jaundice, mononucleosis. No signs or symptoms of a collagen vascular disorder.  He states that his mother had easy bruising. She lived until age 58. Father died at age 61 of some idiopathic neurologic disorder. He has 2 brothers age 68 and 31 who admit to occasional bruising but no significant bleeding. He has a son age 68 and a daughter age 39. The daughter gets occasional bruising.  He is on no antiplatelet agents at present.   PMH: Past Medical History:  Diagnosis Date  . Abnormal bleeding time 01/27/2016   Excessive blood loss after left and right knee surgeries 1/17 & 5/17  Xarelto post 1st surg; ASA post 2nd nl PT, PTT, platlets, LFTs  . Anxiety   . Back pain    severe  . Balance problems   . DJD (degenerative joint disease) of knee   . Elevated cholesterol   . Fatigue   . Gout   . History of blood transfusion   . History of right shoulder fracture   . History of weight change    lost 30 lbs over last 5 years   . HSV-1 (herpes simplex virus 1) infection   . Hyperlipidemia   . Hypertension   . Insomnia   . Numbness and tingling    feet bilat   . Wears glasses   No history of MI, ulcers, thyroid disease, seizure, stroke, or inflammatory arthritis. No pulmonary disease. Denies emphysema or TB. Denies history of malaria.  Past Surgical  History:  Procedure Laterality Date  . naval hernia correction      Dr Lucia Bitter approx 10 years ago   . ROTATOR CUFF REPAIR     right   . TONSILLECTOMY    . TONSILLECTOMY AND ADENOIDECTOMY    . TOTAL KNEE ARTHROPLASTY Left 07/07/2015   Procedure: LEFT TOTAL KNEE ARTHROPLASTY;  Surgeon: Gaynelle Arabian, MD;  Location: [facility];  Service: Orthopedics;  Laterality: Left;  . TOTAL KNEE ARTHROPLASTY Right 11/10/2015   Procedure: TOTAL RIGHT KNEE ARTHROPLASTY;  Surgeon: Gaynelle Arabian, MD;  Location: [facility];  Service: Orthopedics;  Laterality: Right;   Marland Kitchen VASECTOMY  '86    Allergies: Allergies  Allergen Reactions  . Other Rash    STERI STRIPS  . Latex Other (See Comments)    Fever, complications from previous admission due to reactions to Oxycodone and Xarelto   . Oxycodone Other (See Comments)    Back broke out in pustules  . Xarelto [Rivaroxaban] Other (See Comments)    Excessive bleeding, back broke out in blue pustules    Medications:  Current Outpatient Prescriptions:  .  acyclovir (ZOVIRAX) 400 MG tablet, Take 1 tablet (400 mg total) by mouth 2 (two) times daily., Disp: 180 tablet, Rfl: 3 .  allopurinol (ZYLOPRIM) 300 MG tablet, Take 1 tablet (300 mg total) by mouth daily., Disp: 90 tablet, Rfl: 3 .  ALPRAZolam (XANAX) 0.5 MG tablet, Take 1 tablet (0.5 mg total) by mouth 3 (three) times daily as needed for sleep., Disp: 270 tablet, Rfl: 3 .  amLODipine (NORVASC) 5 MG tablet, Take 5 mg by mouth daily., Disp: , Rfl:  .  aspirin EC 325 MG EC tablet, Take 1 tablet (325 mg total) by mouth 2 (two) times daily. Take a 325 mg Aspirin twice a day for three weeks and then reduce to a baby 81 mg Aspirin daily for three additional weeks., Disp: 38 tablet, Rfl: 0 .  benazepril (LOTENSIN) 40 MG tablet, Take 1 tablet (40 mg total) by mouth daily., Disp: 90 tablet, Rfl: 3 .  carvedilol (COREG CR) 10 MG 24 hr capsule, Take 1 capsule (10 mg total) by mouth daily. (Patient not taking: Reported on 10/22/2015), Disp: 30 capsule, Rfl: 0 .  cephALEXin (KEFLEX) 250 MG capsule, Take 250 mg by mouth 3 (three) times daily., Disp: , Rfl:  .  docusate sodium (COLACE) 100 MG capsule, Take 100 mg by mouth daily as needed for mild constipation., Disp: , Rfl:  .  fluticasone (FLONASE) 50 MCG/ACT nasal spray, Place 2 sprays into the nose as directed. (Patient not taking: Reported on 11/15/2015), Disp: 16 g, Rfl: 3 .  HYDROmorphone (DILAUDID) 2 MG tablet, Take 1-2 tablets (2-4 mg total) by mouth every 4 (four) hours as needed for moderate pain or severe pain., Disp:  80 tablet, Rfl: 0 .  methocarbamol (ROBAXIN) 500 MG tablet, Take 1 tablet (500 mg total) by mouth every 6 (six) hours as needed for muscle spasms., Disp: 80 tablet, Rfl: 0 .  minocycline (MINOCIN,DYNACIN) 100 MG capsule, Take 1 capsule (100 mg total) by mouth 2 (two) times daily. (Patient not taking: Reported on 10/22/2015), Disp: 8 capsule, Rfl: 0 .  polyethylene glycol (MIRALAX / GLYCOLAX) packet, Take 17 g by mouth daily. (Patient taking differently: Take 17 g by mouth daily as needed (For constipation.). ), Disp: 14 each, Rfl: 0 .  simvastatin (ZOCOR) 20 MG tablet, Take 1 tablet (20 mg total) by mouth at bedtime., Disp: 90 tablet, Rfl: 1 .  traMADol (  ULTRAM) 50 MG tablet, Take 1-2 tablets (50-100 mg total) by mouth every 6 (six) hours as needed (mild pain)., Disp: 90 tablet, Rfl: 1  Social History:   reports that he has never smoked. He has never used smokeless tobacco. He reports that he drinks about 1.0 oz of alcohol per week . He reports that he does not use drugs.  Family History: Family History  Problem Relation Age of Onset  . Alzheimer's disease Mother   . Glaucoma Mother   . Cancer Neg Hx   . Diabetes Neg Hx   . Heart disease Brother     murmur    Review of Systems: See interim history Remaining ROS negative.  Physical Exam: Blood pressure (!) 170/81, pulse (!) 108, temperature 98.4 F (36.9 C), temperature source Oral, height 6' (1.829 m), weight 185 lb (83.9 kg), SpO2 99 %. Wt Readings from Last 3 Encounters:  01/27/16 185 lb (83.9 kg)  11/10/15 179 lb 2 oz (81.3 kg)  10/31/15 179 lb 2 oz (81.3 kg)     General appearance: Thin but adequately nourished Caucasian man HENNT: No conjunctival or oral mucosal angiomas. Pharynx no erythema, exudate, mass, or ulcer. No thyromegaly or thyroid nodules Lymph nodes: No cervical, supraclavicular, or axillary lymphadenopathy Breasts:  Lungs: Clear to auscultation, resonant to percussion throughout Heart: Regular rhythm, no  murmur, no gallop, no rub, no click, no edema Abdomen: Soft, nontender, normal bowel sounds, no mass, no organomegaly Extremities: No edema, no calf tenderness Musculoskeletal: no joint deformities GU: Not examined Vascular: Carotid pulses 2+, no bruits, distal pulses: Dorsalis pedis 1+ symmetric Neurologic: Alert, oriented, PERRLA, optic discs sharp and vessels normal, no hemorrhage or exudate, cranial nerves grossly normal, motor strength 5 over 5, reflexes 1+ symmetric, upper body coordination normal, gait normal, Skin: No rash or ecchymosis. Multiple small hemangiomas on the skin of the trunk and back.    Lab Results: Lab Results  Component Value Date   WBC 9.2 11/15/2015   HGB 7.0 (L) 11/15/2015   HCT 20.6 (L) 11/15/2015   MCV 85.5 11/15/2015   PLT 195 11/15/2015     Chemistry      Component Value Date/Time   NA 136 11/15/2015 0540   K 3.7 11/15/2015 0540   CL 103 11/15/2015 0540   CO2 27 11/15/2015 0540   BUN 10 11/15/2015 0540   CREATININE 0.66 11/15/2015 0540      Component Value Date/Time   CALCIUM 8.4 (L) 11/15/2015 0540   ALKPHOS 131 (H) 10/31/2015 1400   AST 25 10/31/2015 1400   ALT 15 (L) 10/31/2015 1400   BILITOT 0.6 10/31/2015 1400       Review of peripheral blood film:Pending   Radiological Studies: No results found.    Impression: Exaggerated postoperative bleeding occurring on 2 separate occasions following left then right knee joint replacement. After the first surgery, increased bleeding was thought to be related to Xarelto anticoagulant however, he had similar exaggerated bleeding when aspirin was used as thromboprophylaxis. Platelet count and baseline coagulation studies repeatedly normal. No strong family history of any bleeding disorder.  Clinical suspicion for a significant underlying blood dyscrasia is low. I will screen him for von Willebrand's disease and for factor XIII deficiency. If these tests return normal, I would consider getting   platelet function studies to exclude a qualitative platelet disorder. For accurate results, sample best sent to a local [facility] lab.  I'll call him when results are available to determine if additional testing is  indicated.       Annia Belt, MD 01/27/2016, 3:12 PM

## 2016-01-27 NOTE — Patient Instructions (Signed)
To lab today I will call with results - may take up to 2 weeks Return visit as needed

## 2016-01-29 ENCOUNTER — Other Ambulatory Visit: Payer: Self-pay | Admitting: Oncology

## 2016-01-29 DIAGNOSIS — R791 Abnormal coagulation profile: Secondary | ICD-10-CM

## 2016-01-29 LAB — COAG STUDIES INTERP REPORT: PDF IMAGE: 0

## 2016-01-29 LAB — CBC WITH DIFFERENTIAL/PLATELET
BASOS: 0 %
Basophils Absolute: 0 10*3/uL (ref 0.0–0.2)
EOS (ABSOLUTE): 0.1 10*3/uL (ref 0.0–0.4)
Eos: 1 %
HEMATOCRIT: 40.7 % (ref 37.5–51.0)
HEMOGLOBIN: 13.5 g/dL (ref 12.6–17.7)
IMMATURE GRANS (ABS): 0 10*3/uL (ref 0.0–0.1)
Immature Granulocytes: 0 %
LYMPHS ABS: 1.4 10*3/uL (ref 0.7–3.1)
LYMPHS: 15 %
MCH: 28.1 pg (ref 26.6–33.0)
MCHC: 33.2 g/dL (ref 31.5–35.7)
MCV: 85 fL (ref 79–97)
MONOCYTES: 7 %
Monocytes Absolute: 0.7 10*3/uL (ref 0.1–0.9)
NEUTROS ABS: 7 10*3/uL (ref 1.4–7.0)
Neutrophils: 77 %
Platelets: 225 10*3/uL (ref 150–379)
RBC: 4.81 x10E6/uL (ref 4.14–5.80)
RDW: 15.4 % (ref 12.3–15.4)
WBC: 9.2 10*3/uL (ref 3.4–10.8)

## 2016-01-29 LAB — VON WILLEBRAND PANEL
Factor VIII Activity: 140 % (ref 57–163)
Von Willebrand Ag: 130 % (ref 50–200)
Von Willebrand Factor: 147 % (ref 50–200)

## 2016-01-29 LAB — FACTOR 13 ACTIVITY: Factor XIII: NORMAL

## 2016-02-02 ENCOUNTER — Other Ambulatory Visit (INDEPENDENT_AMBULATORY_CARE_PROVIDER_SITE_OTHER): Payer: Medicare Other

## 2016-02-02 DIAGNOSIS — R791 Abnormal coagulation profile: Secondary | ICD-10-CM

## 2016-02-02 DIAGNOSIS — R58 Hemorrhage, not elsewhere classified: Secondary | ICD-10-CM

## 2016-02-03 LAB — MISCELLANEOUS TEST

## 2016-02-20 ENCOUNTER — Encounter: Payer: Self-pay | Admitting: Oncology

## 2016-02-20 NOTE — Progress Notes (Signed)
Hematology: I have completed my evaluation of Mr. Shaun Carey.  He was referred for excessive bleeding after knee surgery which occurred after surgery on his left knee in January 2017 after which she was put on Xarelto anticoagulation, and again after right knee surgery in May 2017 when he was put on aspirin for thromboprophylaxis. He has a normal pro time, PTT, and platelet count. Normal liver functions. Factor XIII screen came back normal. Platelet function studies sent to [facility] coagulation lab normal to all of the common agonists used.  I called to discuss these results with the patient. None of these tests are perfect and he could still have a mild qualitative platelet abnormality not picked up by the standard platelet function tests. Further testing would require evaluation at a [facility] where more sophisticated studies could be done such as electron microscopy to look for a platelet storage pool defect. I think this would be low yield and the patient does not want to pursue this further.

## 2017-08-12 IMAGING — CR DG KNEE COMPLETE 4+V*L*
4 series · 4 of 4 positions shown · non-contrast
Comparison: None.

CLINICAL DATA: Status post left knee replacement 4 days ago,
swelling and oozing from wound

EXAM:
LEFT KNEE - COMPLETE 4+ VIEW

[knee lat]
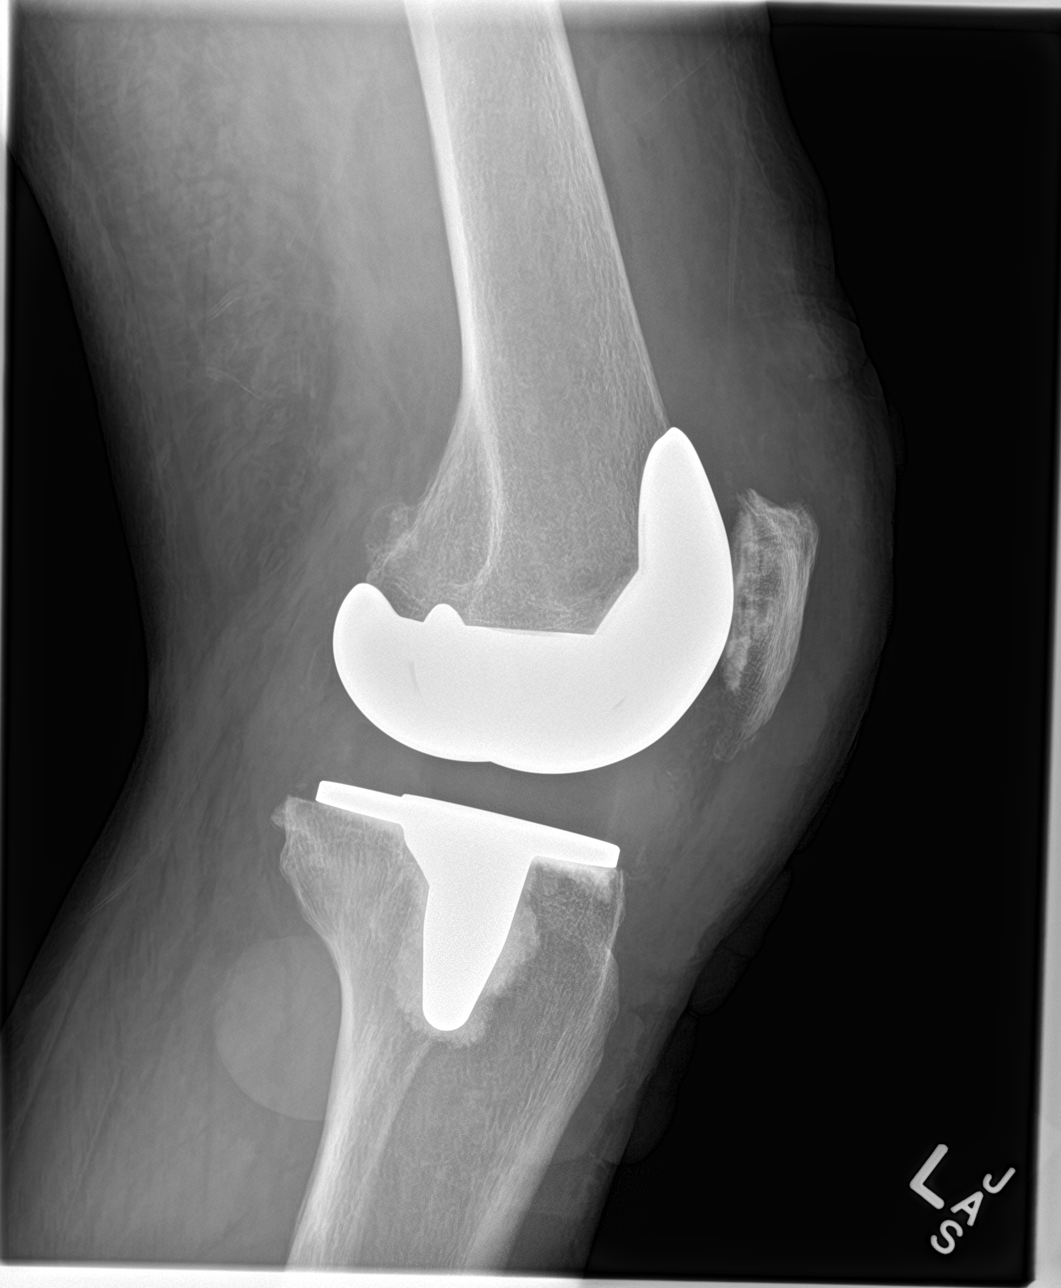

[knee obl (1 of 3)]
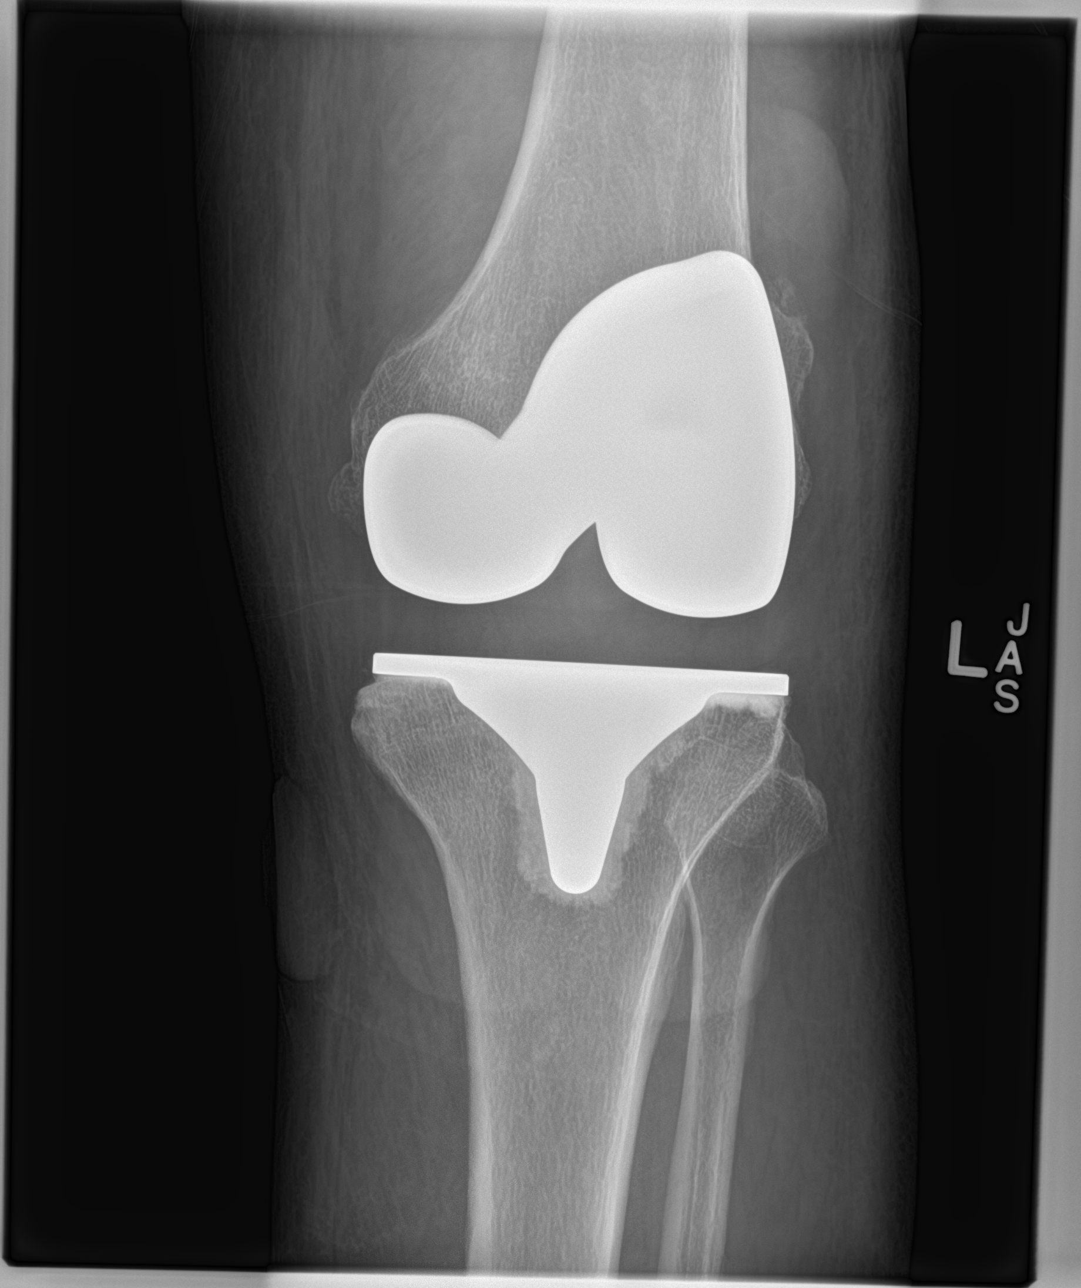

[knee obl (2 of 3)]
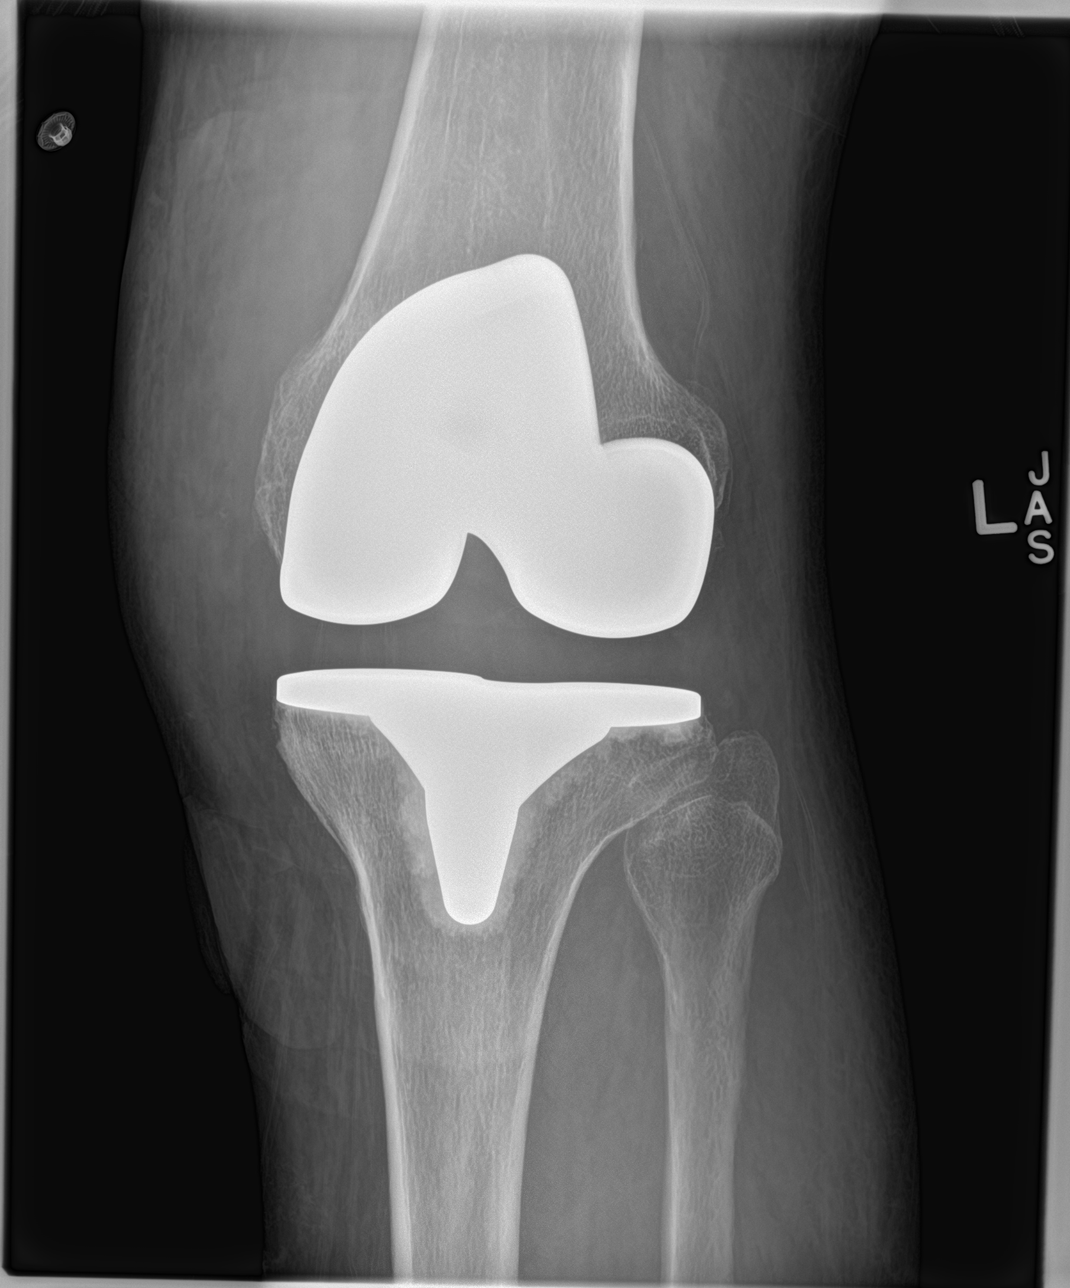

[knee obl (3 of 3)]
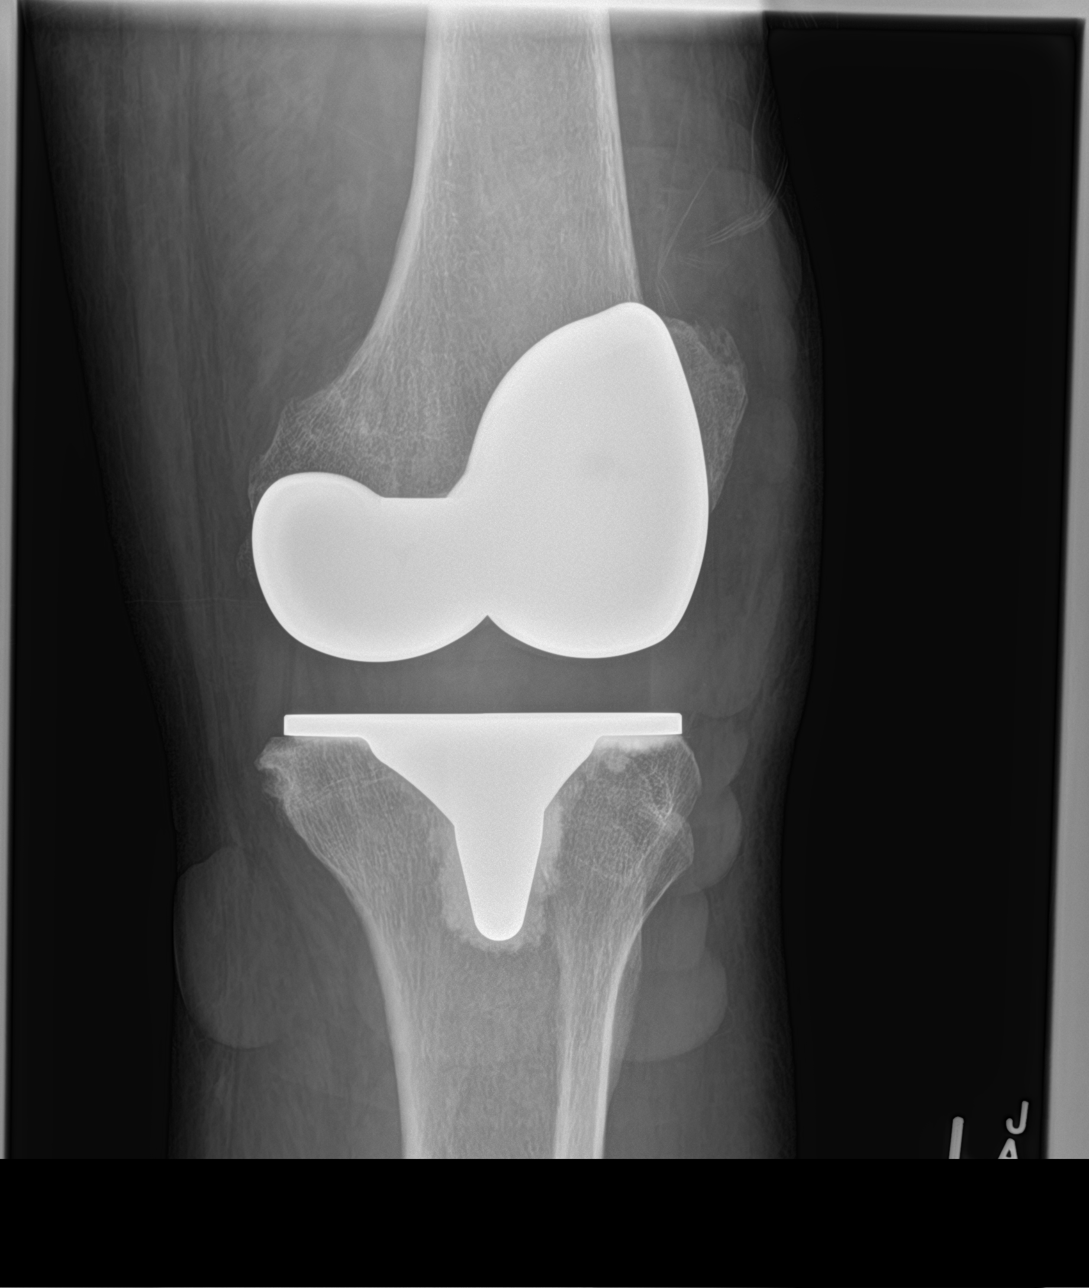

[4 of 4 positions shown; findings below may reference images not displayed]

FINDINGS: Four views of the left knee submitted. There is left knee prosthesis
with anatomic alignment. No evidence of prosthesis loosening. No
acute fracture or subluxation. Soft tissue swelling is noted
prepatellar and suprapatellar region. Moderate joint effusion.
IMPRESSION: There is left knee prosthesis with anatomic alignment. No evidence
of prosthesis loosening. Prepatellar soft tissue swelling. Moderate
joint effusion.

## 2017-08-12 IMAGING — CR DG CHEST 2V
2 series · 2 of 2 positions shown · non-contrast
Comparison: Chest radiograph performed 07/10/2015

CLINICAL DATA: Acute onset of fever. Status post recent total knee
replacement. Initial encounter.

EXAM:
CHEST  2 VIEW

[chest pa]
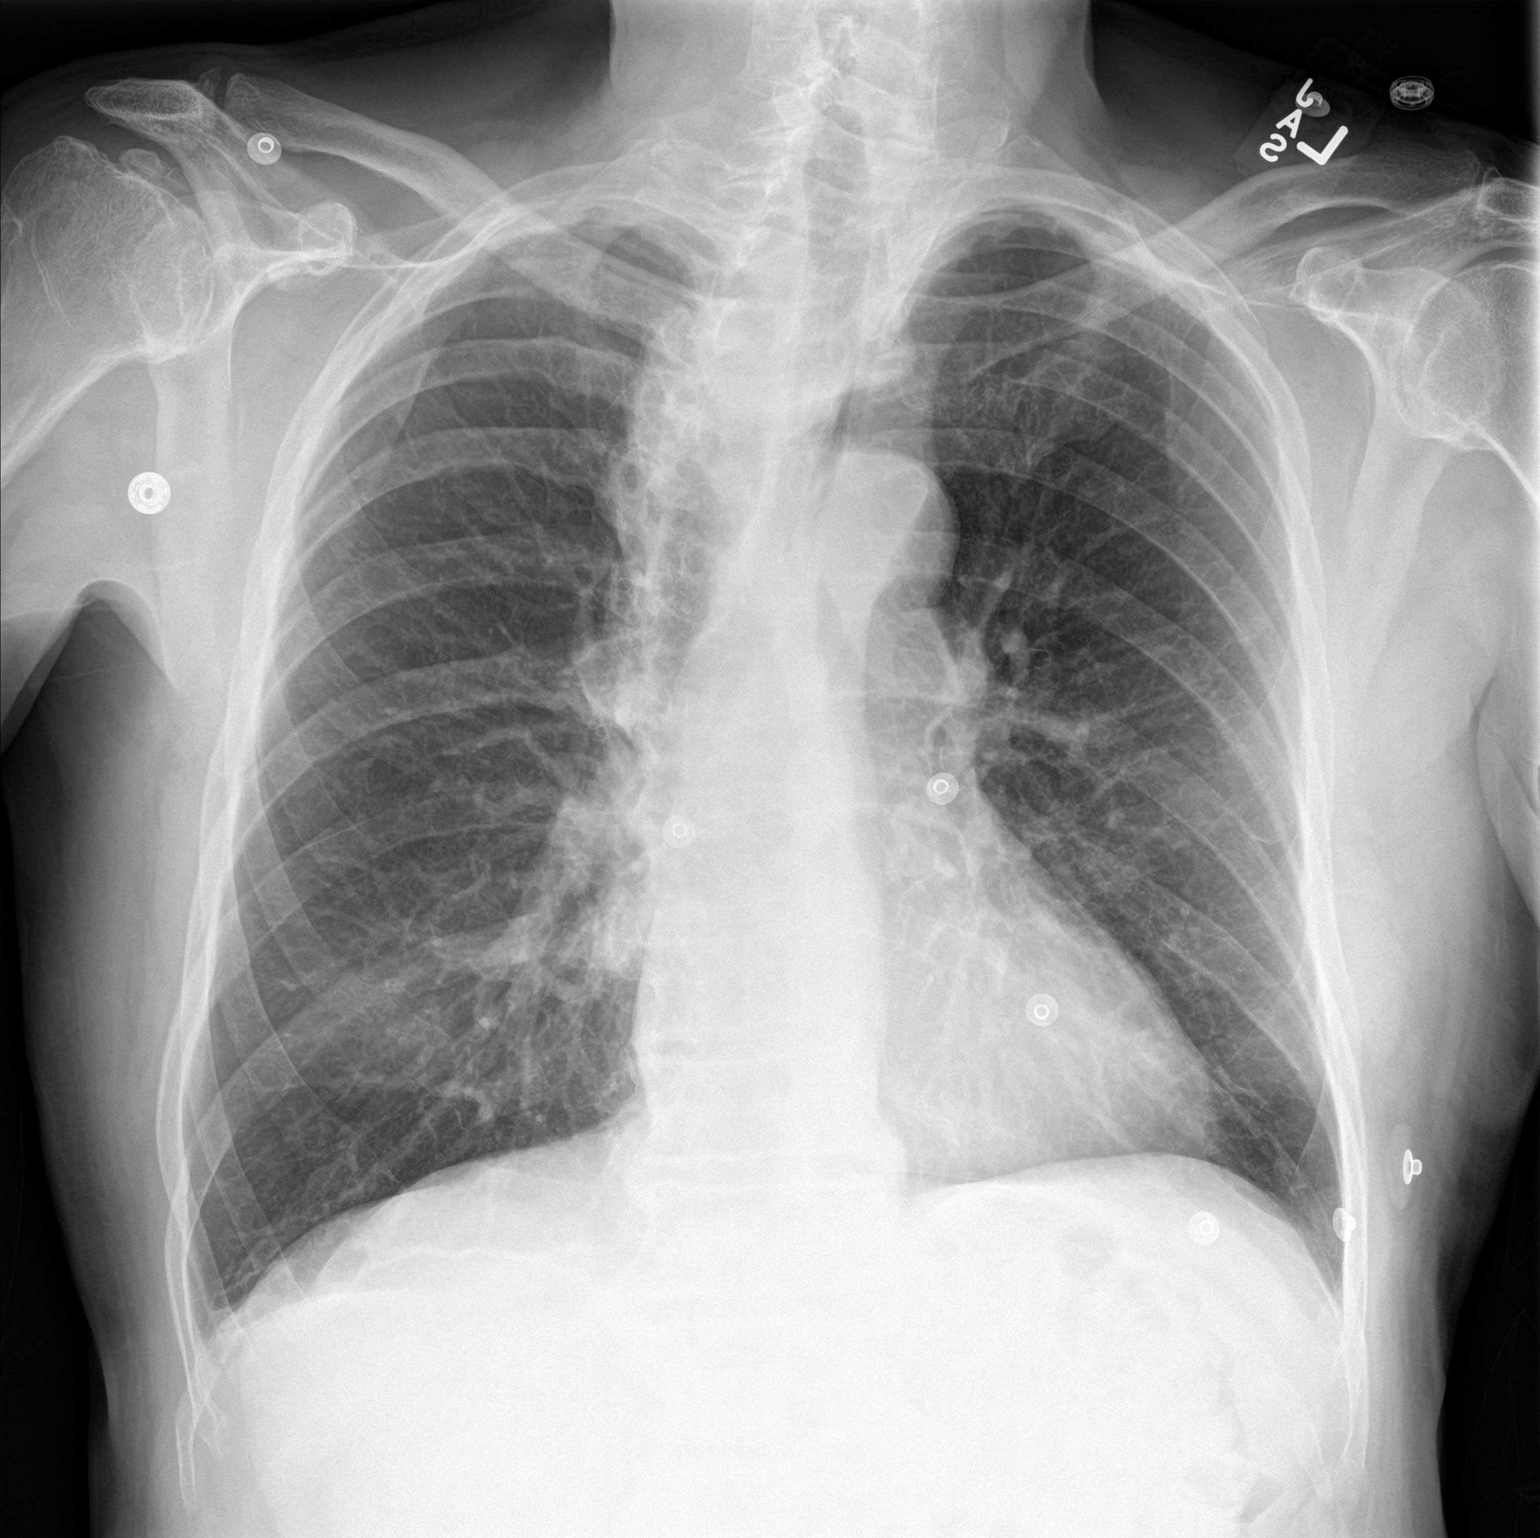

[chest lat]
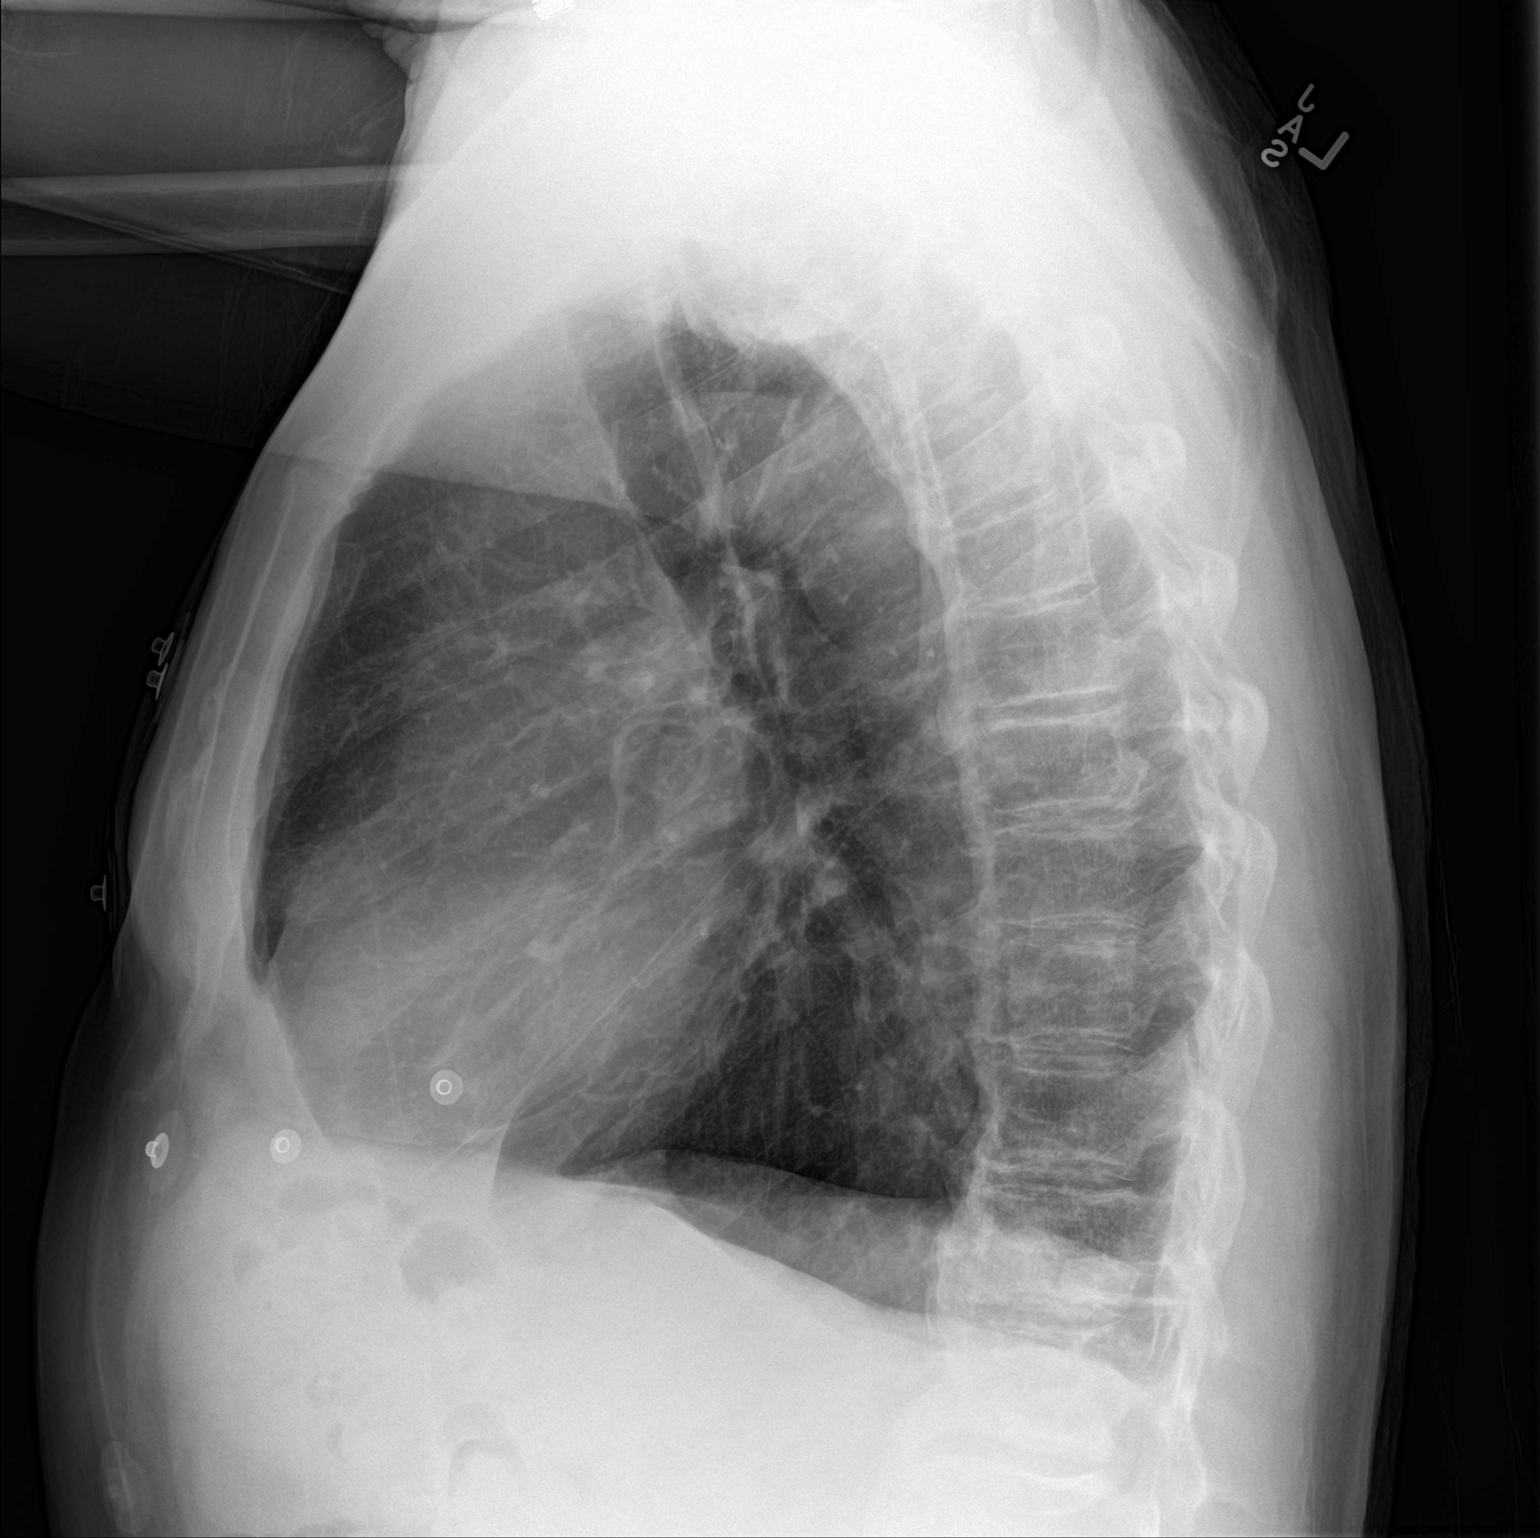

[2 of 2 positions shown; findings below may reference images not displayed]

FINDINGS: The lungs are well-aerated and clear. There is no evidence of focal
opacification, pleural effusion or pneumothorax.

The heart is normal in size; the mediastinal contour is within
normal limits. No acute osseous abnormalities are seen. Anterior
bridging osteophytes are noted along the thoracic spine.
IMPRESSION: No acute cardiopulmonary process seen.

## 2019-07-10 ENCOUNTER — Ambulatory Visit: Payer: Medicare Other | Attending: Internal Medicine

## 2019-07-10 DIAGNOSIS — Z23 Encounter for immunization: Secondary | ICD-10-CM | POA: Insufficient documentation

## 2019-07-10 NOTE — Progress Notes (Signed)
   Covid-19 Vaccination Clinic  Name:  Shaun Carey    MRN: [id number] DOB: Jul 15, 1940  07/10/2019  Mr. Shaun Carey was observed post Covid-19 immunization for 15 minutes without incidence. He was provided with Vaccine Information Sheet and instruction to access the V-Safe system.   Mr. Shaun Carey was instructed to call 911 with any severe reactions post vaccine: Marland Kitchen Difficulty breathing  . Swelling of your face and throat  . A fast heartbeat  . A bad rash all over your body  . Dizziness and weakness    Immunizations Administered    Name Date Dose VIS Date Route   Pfizer COVID-19 Vaccine 07/10/2019 10:20 AM 0.3 mL 06/01/2019 Intramuscular   Manufacturer: [employer], [employer]   Lot: [id number]   [place]: [id number]

## 2019-07-29 ENCOUNTER — Ambulatory Visit: Payer: Medicare Other | Attending: Internal Medicine

## 2019-07-29 DIAGNOSIS — Z23 Encounter for immunization: Secondary | ICD-10-CM

## 2019-07-29 NOTE — Progress Notes (Signed)
   Covid-19 Vaccination Clinic  Name:  Shaun Carey    MRN: [id number] DOB: [birth year]  07/29/2019  [name] was observed post Covid-19 immunization for 15 minutes without incidence. He was provided with Vaccine Information Sheet and instruction to access the V-Safe system.   [name] was instructed to call 911 with any severe reactions post vaccine: Marland Kitchen Difficulty breathing  . Swelling of your face and throat  . A fast heartbeat  . A bad rash all over your body  . Dizziness and weakness    Immunizations Administered    Name Date Dose VIS Date Route   Pfizer COVID-19 Vaccine 07/29/2019  1:31 PM 0.3 mL 06/01/2019 Intramuscular   Manufacturer: [place]   Lot: [id number]   [place]: [id number]

## 2020-08-07 DIAGNOSIS — I10 Essential (primary) hypertension: Secondary | ICD-10-CM | POA: Diagnosis not present

## 2022-08-16 ENCOUNTER — Other Ambulatory Visit: Payer: Self-pay | Admitting: Family Medicine

## 2022-08-16 DIAGNOSIS — R251 Tremor, unspecified: Secondary | ICD-10-CM

## 2022-08-16 DIAGNOSIS — R413 Other amnesia: Secondary | ICD-10-CM
